# Patient Record
Sex: Female | Born: 1980 | Race: White | Hispanic: No | Marital: Married | Smoking: Never smoker
Health system: Southern US, Community
[De-identification: ages and names within clinical notes are randomized; demographics above are authoritative.]

## PROBLEM LIST (undated history)

## (undated) ENCOUNTER — Inpatient Hospital Stay (HOSPITAL_COMMUNITY): Payer: Self-pay

## (undated) DIAGNOSIS — O1423 HELLP syndrome (HELLP), third trimester: Secondary | ICD-10-CM

## (undated) DIAGNOSIS — Z789 Other specified health status: Secondary | ICD-10-CM

## (undated) HISTORY — PX: NO PAST SURGERIES: SHX2092

## (undated) SURGERY — Surgical Case
Anesthesia: *Unknown

---

## 2009-04-13 ENCOUNTER — Ambulatory Visit: Payer: Self-pay | Admitting: Family Medicine

## 2009-05-17 ENCOUNTER — Ambulatory Visit: Payer: Self-pay | Admitting: Family Medicine

## 2010-10-12 ENCOUNTER — Observation Stay (HOSPITAL_COMMUNITY)
Admission: AD | Admit: 2010-10-12 | Discharge: 2010-10-13 | Payer: Self-pay | Source: Home / Self Care | Attending: Obstetrics & Gynecology | Admitting: Obstetrics & Gynecology

## 2010-10-13 ENCOUNTER — Ambulatory Visit (HOSPITAL_COMMUNITY)
Admission: RE | Admit: 2010-10-13 | Discharge: 2010-10-13 | Payer: Self-pay | Source: Home / Self Care | Attending: Obstetrics & Gynecology | Admitting: Obstetrics & Gynecology

## 2010-10-14 ENCOUNTER — Inpatient Hospital Stay (HOSPITAL_COMMUNITY)
Admission: RE | Admit: 2010-10-14 | Discharge: 2010-10-17 | Payer: Self-pay | Source: Home / Self Care | Attending: Obstetrics & Gynecology | Admitting: Obstetrics & Gynecology

## 2010-10-14 ENCOUNTER — Encounter: Payer: Self-pay | Admitting: Obstetrics & Gynecology

## 2010-10-26 ENCOUNTER — Encounter: Payer: Self-pay | Admitting: Obstetrics and Gynecology

## 2010-10-26 ENCOUNTER — Ambulatory Visit: Payer: Self-pay | Admitting: Obstetrics and Gynecology

## 2010-10-26 LAB — CONVERTED CEMR LAB
ALT: 22 units/L (ref 0–35)
Alkaline Phosphatase: 121 units/L — ABNORMAL HIGH (ref 39–117)
BUN: 13 mg/dL (ref 6–23)
CO2: 23 meq/L (ref 19–32)
Chloride: 107 meq/L (ref 96–112)
Glucose, Bld: 89 mg/dL (ref 70–99)
HCT: 36.9 % (ref 36.0–46.0)
MCHC: 32 g/dL (ref 30.0–36.0)
RDW: 17 % — ABNORMAL HIGH (ref 11.5–15.5)
Sodium: 140 meq/L (ref 135–145)
Total Bilirubin: 0.3 mg/dL (ref 0.3–1.2)
Total Protein: 6.9 g/dL (ref 6.0–8.3)

## 2010-11-09 ENCOUNTER — Ambulatory Visit: Admit: 2010-11-09 | Payer: Self-pay | Admitting: Obstetrics and Gynecology

## 2010-11-24 NOTE — Discharge Summary (Addendum)
[name], [name]              ACCOUNT NO.:  [id number]  MEDICAL RECORD NO.:  [id number]          PATIENT TYPE:  OBV  LOCATION:  9155                          FACILITY:  [facility]  PHYSICIAN:  Allie Bossier, MD        DATE OF BIRTH:  04-Apr-1981  DATE OF ADMISSION:  10/12/2010 DATE OF DISCHARGE:  10/13/2010                              DISCHARGE SUMMARY   ADMISSION DIAGNOSES:  Estimated gestational age 31 and 6 weeks with hemolysis, elevated liver enzymes, and low platelet syndrome/severe preeclampsia.  DISCHARGE DIAGNOSIS:  Estimated gestational age 42 and 6 weeks with hemolysis, elevated liver enzymes, and low platelet syndrome/severe preeclampsia.  CONDITION:  Guarded.  MEDICATIONS:  Prenatal vitamins.  FOLLOWUP:  The patient has agreed to come back tomorrow, (the patient and her husband agreed to 1 o'clock in the afternoon) for readmission and induction.  HISTORY OF PRESENT ILLNESS AND HOSPITAL COURSE:  [name] is a 30 year old married white gravida 1, para 0 at 57 and 6 weeks' estimated gestational age.  She has had her prenatal care at [facility]. She reports that all was normal until she was seen several days ago (Monday) at that time her blood pressure was noted to be elevated with urine dipping for protein.  Her blood work was ordered and her LFTs were extremely elevated.  She was told to go to [facility] at [facility].  She went there and was told by them that they wanted to induce her.  They refused induction and signed out against medical advice.  When they came to the MAU 2 days later she does have a mild headache but denies nausea or vomiting or visual changes.  LABORATORY WORK:  Here showed her hemoglobin to 10, her platelets to be 87,000.  On October 12, 2010, her glucose was slightly elevated to 112 and her ALT was 57 which has significantly improved from her visit at [facility].  Her AST was 37 and her blood pressures were reasonable.  Hepatitis  panel was negative.  Amylase and lipase were normal.  Urine drug screen was negative, and she had brought with her a 24-hour urine from her collection at home and it showed 1260 mg of protein.  We went ahead and gave her another dose of betamethasone when she arrived.  When I met the patient for the first time on the 15, she and her husband were demanding that they leave against medical advice she says it was her birthday and they wanted to go home and celebrate and they wanted to "prep the puppies and kittens" at home.  She had a consult with Dr. Sherrie George the maternal fetal medicine specialist, who also advised induction of labor.  The patient and her husband understand that by not treating her severe HELLP syndrome that she can have stillbirth and she to have a seizure she can die.  They still signed out against medical advice, but they did agree to come back the following day for induction.     Allie Bossier, MD     MCD/MEDQ  D:  11/01/2010  T:  11/02/2010  Job:  [id number]  Electronically Signed by Nicholaus Bloom MD on 11/24/2010 03:47:28 PM

## 2011-01-09 LAB — CBC
HCT: 18.3 % — ABNORMAL LOW (ref 36.0–46.0)
HCT: 19.3 % — ABNORMAL LOW (ref 36.0–46.0)
HCT: 24.5 % — ABNORMAL LOW (ref 36.0–46.0)
HCT: 27.8 % — ABNORMAL LOW (ref 36.0–46.0)
Hemoglobin: 5.9 g/dL — CL (ref 12.0–15.0)
Hemoglobin: 6.6 g/dL — CL (ref 12.0–15.0)
Hemoglobin: 8.2 g/dL — ABNORMAL LOW (ref 12.0–15.0)
Hemoglobin: 9.4 g/dL — ABNORMAL LOW (ref 12.0–15.0)
Hemoglobin: 9.9 g/dL — ABNORMAL LOW (ref 12.0–15.0)
MCH: 25.9 pg — ABNORMAL LOW (ref 26.0–34.0)
MCH: 26.1 pg (ref 26.0–34.0)
MCH: 26.2 pg (ref 26.0–34.0)
MCH: 26.3 pg (ref 26.0–34.0)
MCH: 26.6 pg (ref 26.0–34.0)
MCH: 26.6 pg (ref 26.0–34.0)
MCH: 27.3 pg (ref 26.0–34.0)
MCHC: 32.3 g/dL (ref 30.0–36.0)
MCHC: 32.6 g/dL (ref 30.0–36.0)
MCHC: 33 g/dL (ref 30.0–36.0)
MCHC: 33.5 g/dL (ref 30.0–36.0)
MCHC: 34.1 g/dL (ref 30.0–36.0)
MCV: 78.9 fL (ref 78.0–100.0)
MCV: 82.3 fL (ref 78.0–100.0)
Platelets: 17 10*3/uL — CL (ref 150–400)
Platelets: 79 10*3/uL — ABNORMAL LOW (ref 150–400)
RBC: 2.51 MIL/uL — ABNORMAL LOW (ref 3.87–5.11)
RBC: 3.44 MIL/uL — ABNORMAL LOW (ref 3.87–5.11)
RBC: 3.95 MIL/uL (ref 3.87–5.11)
RDW: 16.9 % — ABNORMAL HIGH (ref 11.5–15.5)
RDW: 17 % — ABNORMAL HIGH (ref 11.5–15.5)
RDW: 17.4 % — ABNORMAL HIGH (ref 11.5–15.5)
RDW: 19 % — ABNORMAL HIGH (ref 11.5–15.5)
WBC: 11.8 10*3/uL — ABNORMAL HIGH (ref 4.0–10.5)
WBC: 13.4 10*3/uL — ABNORMAL HIGH (ref 4.0–10.5)
WBC: 14.5 10*3/uL — ABNORMAL HIGH (ref 4.0–10.5)
WBC: 16.9 10*3/uL — ABNORMAL HIGH (ref 4.0–10.5)
WBC: 17.4 10*3/uL — ABNORMAL HIGH (ref 4.0–10.5)

## 2011-01-09 LAB — CROSSMATCH
ABO/RH(D): A POS
Antibody Screen: NEGATIVE
Unit division: 0
Unit division: 0
Unit division: 0
Unit division: 0
Unit division: 0

## 2011-01-09 LAB — COMPREHENSIVE METABOLIC PANEL
ALT: 152 U/L — ABNORMAL HIGH (ref 0–35)
ALT: 222 U/L — ABNORMAL HIGH (ref 0–35)
ALT: 345 U/L — ABNORMAL HIGH (ref 0–35)
ALT: 355 U/L — ABNORMAL HIGH (ref 0–35)
ALT: 430 U/L — ABNORMAL HIGH (ref 0–35)
ALT: 515 U/L — ABNORMAL HIGH (ref 0–35)
ALT: 69 U/L — ABNORMAL HIGH (ref 0–35)
AST: 313 U/L — ABNORMAL HIGH (ref 0–37)
AST: 445 U/L — ABNORMAL HIGH (ref 0–37)
AST: 458 U/L — ABNORMAL HIGH (ref 0–37)
AST: 777 U/L — ABNORMAL HIGH (ref 0–37)
Albumin: 1.7 g/dL — ABNORMAL LOW (ref 3.5–5.2)
Albumin: 1.8 g/dL — ABNORMAL LOW (ref 3.5–5.2)
Albumin: 2.5 g/dL — ABNORMAL LOW (ref 3.5–5.2)
Alkaline Phosphatase: 100 U/L (ref 39–117)
Alkaline Phosphatase: 102 U/L (ref 39–117)
Alkaline Phosphatase: 103 U/L (ref 39–117)
Alkaline Phosphatase: 124 U/L — ABNORMAL HIGH (ref 39–117)
Alkaline Phosphatase: 93 U/L (ref 39–117)
Alkaline Phosphatase: 95 U/L (ref 39–117)
BUN: 10 mg/dL (ref 6–23)
BUN: 10 mg/dL (ref 6–23)
BUN: 11 mg/dL (ref 6–23)
BUN: 12 mg/dL (ref 6–23)
BUN: 13 mg/dL (ref 6–23)
BUN: 14 mg/dL (ref 6–23)
CO2: 25 mEq/L (ref 19–32)
CO2: 25 mEq/L (ref 19–32)
CO2: 25 mEq/L (ref 19–32)
CO2: 25 mEq/L (ref 19–32)
CO2: 26 mEq/L (ref 19–32)
Calcium: 6.8 mg/dL — ABNORMAL LOW (ref 8.4–10.5)
Calcium: 6.9 mg/dL — ABNORMAL LOW (ref 8.4–10.5)
Calcium: 7.2 mg/dL — ABNORMAL LOW (ref 8.4–10.5)
Calcium: 7.5 mg/dL — ABNORMAL LOW (ref 8.4–10.5)
Calcium: 7.7 mg/dL — ABNORMAL LOW (ref 8.4–10.5)
Calcium: 8.2 mg/dL — ABNORMAL LOW (ref 8.4–10.5)
Calcium: 8.5 mg/dL (ref 8.4–10.5)
Chloride: 100 mEq/L (ref 96–112)
Chloride: 101 mEq/L (ref 96–112)
Chloride: 102 mEq/L (ref 96–112)
Chloride: 103 mEq/L (ref 96–112)
Chloride: 104 mEq/L (ref 96–112)
Creatinine, Ser: 0.59 mg/dL (ref 0.4–1.2)
Creatinine, Ser: 0.7 mg/dL (ref 0.4–1.2)
GFR calc Af Amer: >60 mL/min (ref >60)
GFR calc Af Amer: >60 mL/min (ref >60)
GFR calc Af Amer: >60 mL/min (ref >60)
GFR calc Af Amer: >60 mL/min (ref >60)
GFR calc Af Amer: >60 mL/min (ref >60)
GFR calc non Af Amer: >60 mL/min (ref >60)
GFR calc non Af Amer: >60 mL/min (ref >60)
GFR calc non Af Amer: >60 mL/min (ref >60)
Glucose, Bld: 118 mg/dL — ABNORMAL HIGH (ref 70–99)
Glucose, Bld: 118 mg/dL — ABNORMAL HIGH (ref 70–99)
Glucose, Bld: 97 mg/dL (ref 70–99)
Glucose, Bld: 99 mg/dL (ref 70–99)
Potassium: 4.3 mEq/L (ref 3.5–5.1)
Potassium: 4.6 mEq/L (ref 3.5–5.1)
Potassium: 4.7 mEq/L (ref 3.5–5.1)
Potassium: 4.8 mEq/L (ref 3.5–5.1)
Potassium: 4.8 mEq/L (ref 3.5–5.1)
Sodium: 131 mEq/L — ABNORMAL LOW (ref 135–145)
Sodium: 131 mEq/L — ABNORMAL LOW (ref 135–145)
Sodium: 132 mEq/L — ABNORMAL LOW (ref 135–145)
Sodium: 134 mEq/L — ABNORMAL LOW (ref 135–145)
Sodium: 136 mEq/L (ref 135–145)
Sodium: 138 mEq/L (ref 135–145)
Total Bilirubin: 0.3 mg/dL (ref 0.3–1.2)
Total Bilirubin: 0.8 mg/dL (ref 0.3–1.2)
Total Bilirubin: 0.9 mg/dL (ref 0.3–1.2)
Total Protein: 4.3 g/dL — ABNORMAL LOW (ref 6.0–8.3)
Total Protein: 4.7 g/dL — ABNORMAL LOW (ref 6.0–8.3)
Total Protein: 5.9 g/dL — ABNORMAL LOW (ref 6.0–8.3)
Total Protein: 6 g/dL (ref 6.0–8.3)

## 2011-01-09 LAB — PREPARE PLATELETS: Unit division: 0

## 2011-01-09 LAB — RPR: RPR Ser Ql: NONREACTIVE

## 2011-01-09 LAB — FIBRINOGEN: Fibrinogen: 258 mg/dL (ref 204–475)

## 2011-01-09 LAB — URIC ACID: Uric Acid, Serum: 6.2 mg/dL (ref 2.4–7.0)

## 2011-01-09 LAB — ABO/RH: ABO/RH(D): A POS

## 2011-01-10 LAB — COMPREHENSIVE METABOLIC PANEL
ALT: 57 U/L — ABNORMAL HIGH (ref 0–35)
AST: 37 U/L (ref 0–37)
Albumin: 2.3 g/dL — ABNORMAL LOW (ref 3.5–5.2)
Alkaline Phosphatase: 96 U/L (ref 39–117)
Chloride: 108 mEq/L (ref 96–112)
GFR calc Af Amer: >60 mL/min (ref >60)
Potassium: 4.2 mEq/L (ref 3.5–5.1)
Sodium: 139 mEq/L (ref 135–145)
Total Bilirubin: 0.4 mg/dL (ref 0.3–1.2)

## 2011-01-10 LAB — DIFFERENTIAL
Basophils Absolute: 0 10*3/uL (ref 0.0–0.1)
Basophils Relative: 0 % (ref 0–1)
Eosinophils Relative: 0 % (ref 0–5)
Lymphocytes Relative: 21 % (ref 12–46)
Monocytes Absolute: 0.5 10*3/uL (ref 0.1–1.0)

## 2011-01-10 LAB — URINE CULTURE
Colony Count: 35000
Culture  Setup Time: 201112142146

## 2011-01-10 LAB — CBC
Platelets: 87 10*3/uL — ABNORMAL LOW (ref 150–400)
RBC: 3.62 MIL/uL — ABNORMAL LOW (ref 3.87–5.11)
WBC: 11.1 10*3/uL — ABNORMAL HIGH (ref 4.0–10.5)

## 2011-01-10 LAB — CREATININE CLEARANCE, URINE, 24 HOUR
Collection Interval-CRCL: 24 hours
Urine Total Volume-CRCL: 3000 mL

## 2011-01-10 LAB — URINALYSIS, ROUTINE W REFLEX MICROSCOPIC
Bilirubin Urine: NEGATIVE
Nitrite: NEGATIVE
Specific Gravity, Urine: 1.025 (ref 1.005–1.030)
Urobilinogen, UA: 0.2 mg/dL (ref 0.0–1.0)

## 2011-01-10 LAB — PROTEIN / CREATININE RATIO, URINE
Protein Creatinine Ratio: 2.8 — ABNORMAL HIGH (ref 0.00–0.15)
Total Protein, Urine: 341 mg/dL

## 2011-01-10 LAB — RAPID URINE DRUG SCREEN, HOSP PERFORMED
Barbiturates: NOT DETECTED
Benzodiazepines: NOT DETECTED

## 2011-01-10 LAB — PROTEIN, URINE, 24 HOUR
Collection Interval-UPROT: 24 hours
Protein, Urine: 42 mg/dL

## 2011-01-10 LAB — URINE MICROSCOPIC-ADD ON

## 2011-01-10 LAB — LIPASE, BLOOD: Lipase: 35 U/L (ref 11–59)

## 2011-01-10 LAB — HEPATITIS PANEL, ACUTE: HCV Ab: NEGATIVE

## 2011-04-11 IMAGING — CR DG CHEST 2V
1 series · 2 of 2 positions shown · non-contrast
Comparison: none

REASON FOR EXAM: dyspnea
COMMENTS:

[Series 1: view not recorded · 0.17mm/px · 2 of 2 slices shown]
[im 1/2]
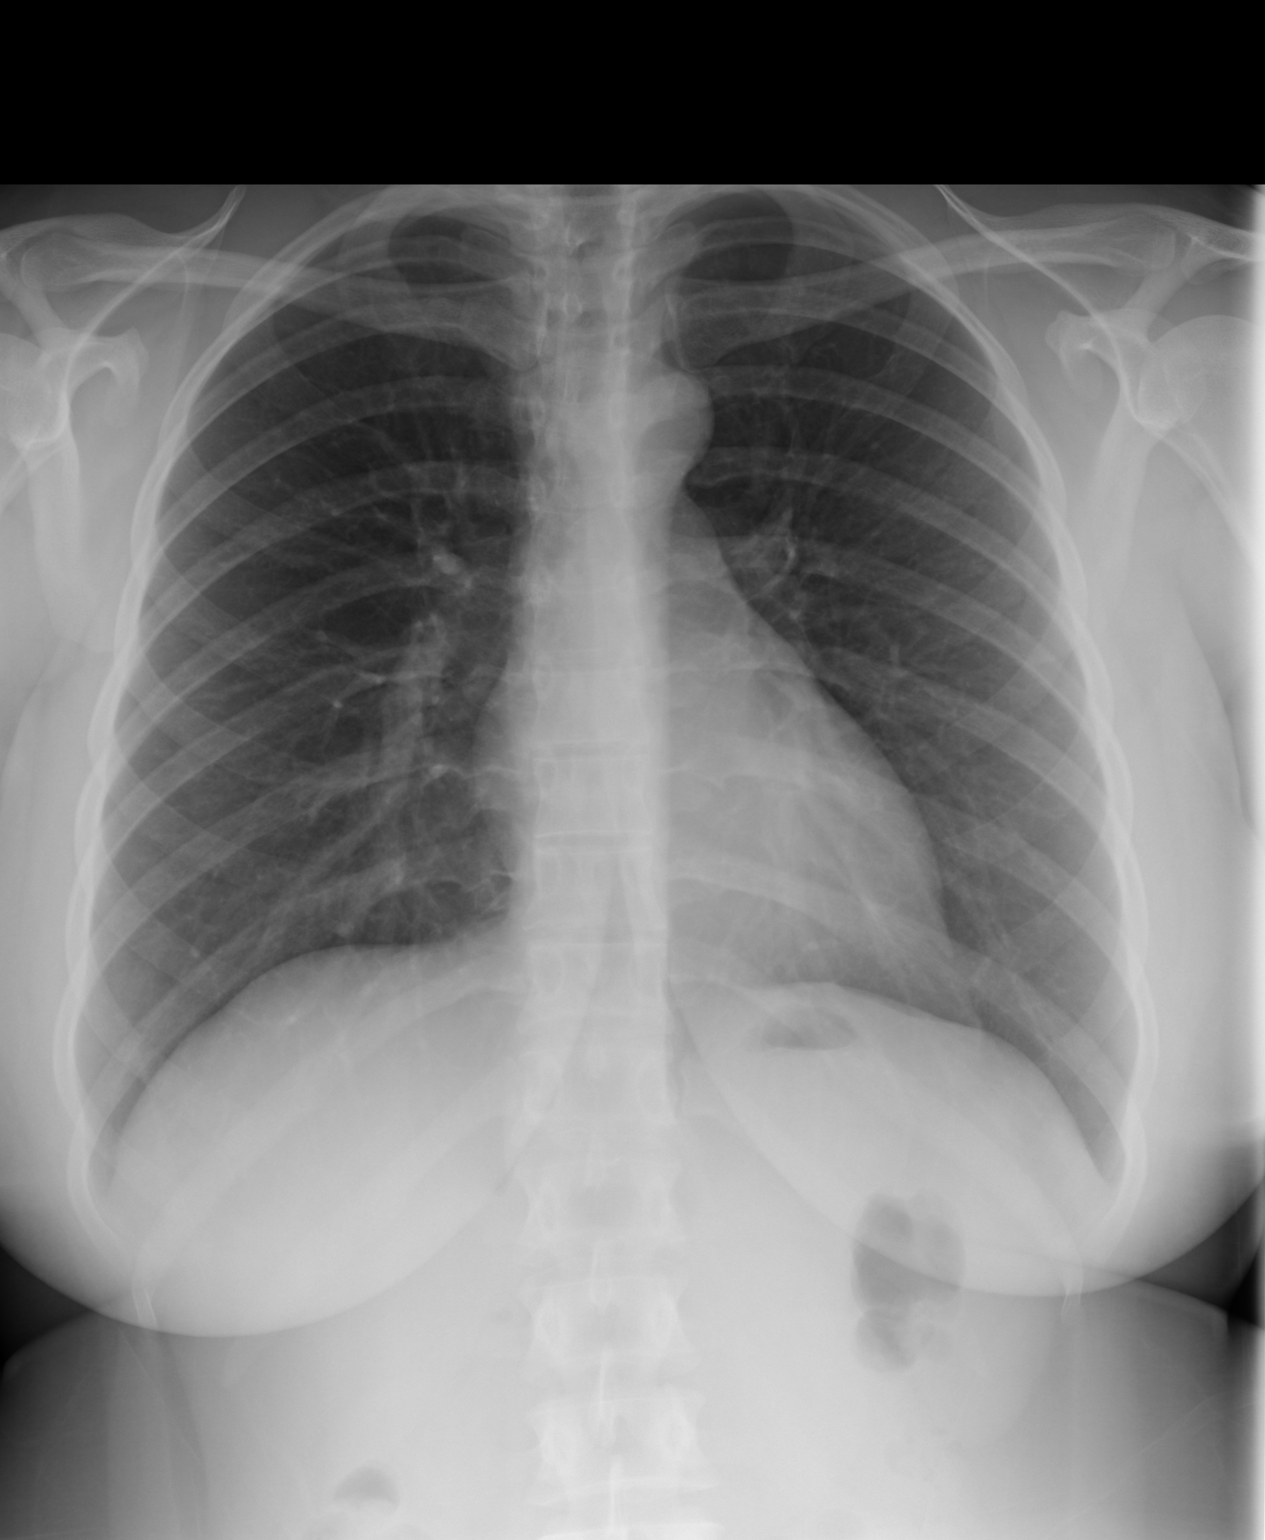
[im 2/2]
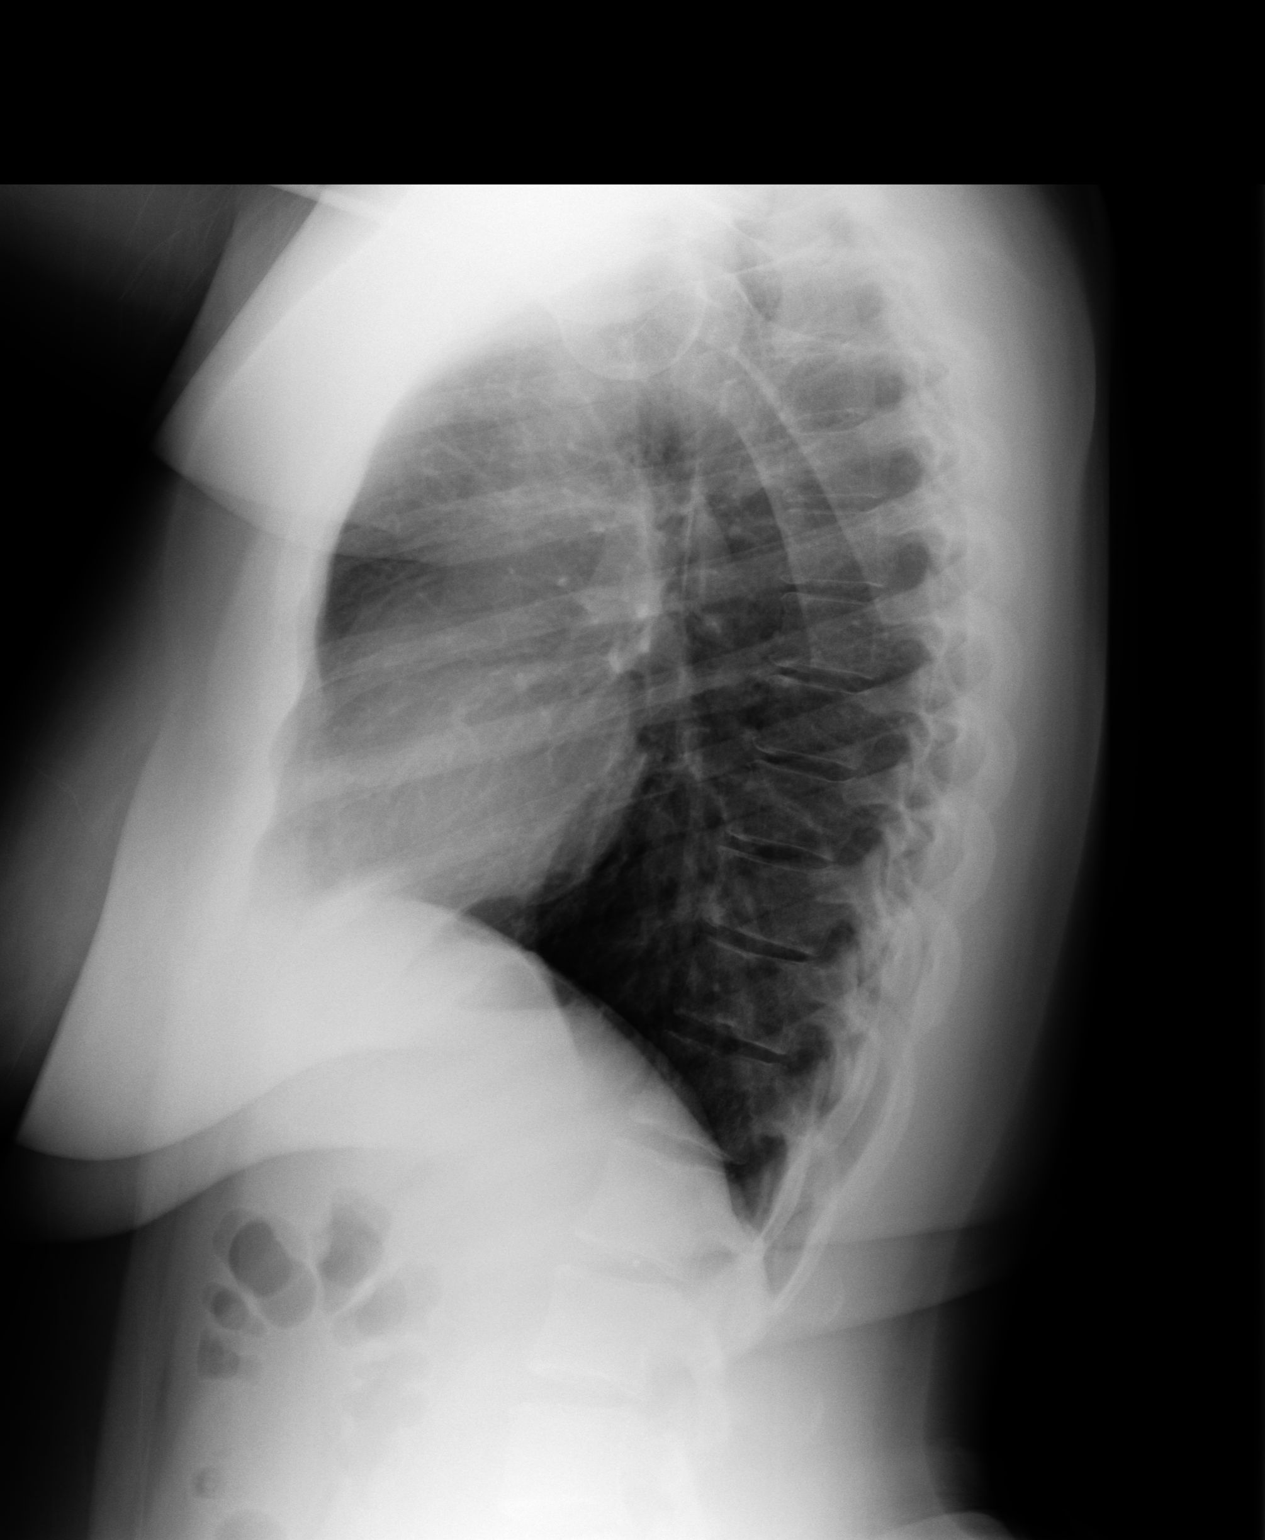

[2 of 2 positions shown; findings below may reference images not displayed]

PROCEDURE:     DXR - DXR CHEST PA (OR AP) AND LATERAL  - May 17, 2009  [DATE]

RESULT:     There is no previous exam for comparison.

The lungs are clear. The heart and pulmonary vessels are normal. The bony
and mediastinal structures are unremarkable. There is no effusion. There is
no pneumothorax or evidence of congestive failure.
IMPRESSION: No acute cardiopulmonary disease.

## 2011-10-31 NOTE — L&D Delivery Note (Signed)
Delivery Note At 4:23 PM a viable female was delivered via Vaginal, Spontaneous Delivery (Presentation: ; Occiput Anterior). APGAR: 9, 9; weight 7 lb 3.5 oz (3274 g).   Placenta status: Intact, Spontaneous.  Cord: 3 vessels   Anesthesia: Local  Episiotomy: None Lacerations: 2nd degree Suture Repair: 3.0 monocryl Est. Blood Loss (mL):   Mom to postpartum.  Baby to rooming in.  [name] 01/05/2012, 6:23 PM

## 2011-11-22 LAB — HIV ANTIBODY (ROUTINE TESTING W REFLEX): HIV: NONREACTIVE

## 2011-11-22 LAB — CBC: Platelets: 201 10*3/uL (ref 150–399)

## 2011-11-22 LAB — ABO/RH: RH Type: POSITIVE

## 2011-11-24 ENCOUNTER — Inpatient Hospital Stay (HOSPITAL_COMMUNITY)
Admission: AD | Admit: 2011-11-24 | Discharge: 2011-11-26 | DRG: 781 | Disposition: A | Discharge status: Home / Self Care | Payer: Medicaid Other | Source: Ambulatory Visit | Attending: Obstetrics and Gynecology | Admitting: Obstetrics and Gynecology

## 2011-11-24 ENCOUNTER — Encounter (HOSPITAL_COMMUNITY): Payer: Self-pay | Admitting: *Deleted

## 2011-11-24 DIAGNOSIS — O09899 Supervision of other high risk pregnancies, unspecified trimester: Secondary | ICD-10-CM

## 2011-11-24 DIAGNOSIS — O0930 Supervision of pregnancy with insufficient antenatal care, unspecified trimester: Secondary | ICD-10-CM

## 2011-11-24 DIAGNOSIS — O3430 Maternal care for cervical incompetence, unspecified trimester: Secondary | ICD-10-CM | POA: Diagnosis present

## 2011-11-24 DIAGNOSIS — R4789 Other speech disturbances: Secondary | ICD-10-CM | POA: Diagnosis present

## 2011-11-24 DIAGNOSIS — O99810 Abnormal glucose complicating pregnancy: Secondary | ICD-10-CM | POA: Diagnosis present

## 2011-11-24 DIAGNOSIS — O24419 Gestational diabetes mellitus in pregnancy, unspecified control: Secondary | ICD-10-CM | POA: Diagnosis present

## 2011-11-24 DIAGNOSIS — O09299 Supervision of pregnancy with other poor reproductive or obstetric history, unspecified trimester: Secondary | ICD-10-CM

## 2011-11-24 DIAGNOSIS — O4700 False labor before 37 completed weeks of gestation, unspecified trimester: Secondary | ICD-10-CM | POA: Diagnosis present

## 2011-11-24 DIAGNOSIS — IMO0002 Reserved for concepts with insufficient information to code with codable children: Secondary | ICD-10-CM | POA: Diagnosis present

## 2011-11-24 DIAGNOSIS — O26879 Cervical shortening, unspecified trimester: Principal | ICD-10-CM | POA: Diagnosis present

## 2011-11-24 DIAGNOSIS — O3660X0 Maternal care for excessive fetal growth, unspecified trimester, not applicable or unspecified: Secondary | ICD-10-CM | POA: Diagnosis present

## 2011-11-24 LAB — GLUCOSE, CAPILLARY: Glucose-Capillary: 199 mg/dL — ABNORMAL HIGH (ref 70–99)

## 2011-11-24 MED ORDER — PRENATAL MULTIVITAMIN CH: 1.0000 | ORAL_TABLET | Freq: Every day | ORAL | Status: DC

## 2011-11-24 MED ORDER — DOCUSATE SODIUM 100 MG PO CAPS: 100.0000 mg | ORAL_CAPSULE | Freq: Every day | ORAL | Status: DC

## 2011-11-24 MED ORDER — ACETAMINOPHEN 325 MG PO TABS: 650.0000 mg | ORAL_TABLET | ORAL | Status: DC | PRN

## 2011-11-24 MED ORDER — BETAMETHASONE SOD PHOS & ACET 6 (3-3) MG/ML IJ SUSP
12.0000 mg | INTRAMUSCULAR | Status: AC
  Administered 2011-11-24 – 2011-11-25 (×2): 12 mg via INTRAMUSCULAR
  Filled 2011-11-24 (×2): qty 2

## 2011-11-24 MED ORDER — PRENATAL MULTIVITAMIN CH
1.0000 | ORAL_TABLET | Freq: Every day | ORAL | Status: DC
  Administered 2011-11-24 – 2011-11-25 (×2): 1 via ORAL
  Filled 2011-11-24 (×2): qty 1

## 2011-11-24 MED ORDER — ZOLPIDEM TARTRATE 10 MG PO TABS: 10.0000 mg | ORAL_TABLET | Freq: Every evening | ORAL | Status: DC | PRN

## 2011-11-24 MED ORDER — CALCIUM CARBONATE ANTACID 500 MG PO CHEW
2.0000 | CHEWABLE_TABLET | ORAL | Status: DC | PRN
  Administered 2011-11-24 – 2011-11-25 (×2): 200 mg via ORAL
  Filled 2011-11-24: qty 2
  Filled 2011-11-24: qty 1

## 2011-11-24 MED ORDER — DOCUSATE SODIUM 100 MG PO CAPS
100.0000 mg | ORAL_CAPSULE | Freq: Every day | ORAL | Status: DC
  Administered 2011-11-24 – 2011-11-25 (×2): 100 mg via ORAL
  Filled 2011-11-24 (×2): qty 1
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
## 2011-11-24 NOTE — Progress Notes (Signed)
Notified Dr. Stefano Gaul of 2hr pp cbg of 199, no new orders received at this time. Cheral Marker

## 2011-11-24 NOTE — Progress Notes (Signed)
Reviewed 1 hour glucola result of 200 with patient, and discussed plan of care with Dr. Stefano Gaul. Plan carb modified diet, FBS and 2 hour pcs, Patient seems to understand issue and is agreeable with plan.  Nigel Bridgeman, CNM, [place] 11/24/11 7p

## 2011-11-25 LAB — GLUCOSE, CAPILLARY
Glucose-Capillary: 125 mg/dL — ABNORMAL HIGH (ref 70–99)
Glucose-Capillary: 167 mg/dL — ABNORMAL HIGH (ref 70–99)

## 2011-11-25 NOTE — Progress Notes (Signed)
Notified [name], CNM of onset of mild uc's, not perceived by pt, approximately 1 hour ago, increasing in frequency to q 2-5, and continuing despite pt voiding.  Stated to have pt drink plenty of water over 15 minutes and keep bladder empty- call back if not resolving.   Cheral Marker

## 2011-11-25 NOTE — Progress Notes (Addendum)
31 y.o. year old female,at [redacted]w[redacted]d gestation.  SUBJECTIVE:  Patient reports that she is feeling better today. She feels very few contractions.  OBJECTIVE:  BP 117/78  Pulse 98  Temp(Src) 98 F (36.7 C) (Oral)  Resp 18  Ht 5\' 1"  (1.549 m)  Wt 95.255 kg (210 lb)  BMI 39.68 kg/m2  SpO2 99%  Fetal Heart Tones:  Category 1  Contractions:          Mild contractions versus uterine irritability  Chest: Clear  Heart: Regular rate and rhythm  Abdomen: Gravid, nontender  Extremities: No masses, nontender  Fasting blood sugar: 125  2 hour p.c. blood sugars: 199, 200, 154  ASSESSMENT:  [redacted]w[redacted]d Weeks Pregnancy  Preterm uterine contractions  Preterm cervical change  Gestational diabetes with elevated blood sugars do to betamethasone  PLAN:  The patient will receive her second dose of betamethasone today.  We will continue to monitor her blood sugars.  We will obtain a fetal fibronectin tomorrow.  We will check the results of her beta strep, gonorrhea, and chlamydia test.  If the patient remains stable and if her fetal fibronectin is negative, then we will consider discharging the patient to home on 11/26/2011. She will begin to make arrangements to maintain bed rest once she gets home.  Leonard Schwartz, M.D.

## 2011-11-26 DIAGNOSIS — O24419 Gestational diabetes mellitus in pregnancy, unspecified control: Secondary | ICD-10-CM | POA: Diagnosis present

## 2011-11-26 DIAGNOSIS — O3430 Maternal care for cervical incompetence, unspecified trimester: Secondary | ICD-10-CM | POA: Diagnosis present

## 2011-11-26 DIAGNOSIS — O26879 Cervical shortening, unspecified trimester: Secondary | ICD-10-CM | POA: Diagnosis present

## 2011-11-26 DIAGNOSIS — R479 Unspecified speech disturbances: Secondary | ICD-10-CM | POA: Insufficient documentation

## 2011-11-26 DIAGNOSIS — IMO0002 Reserved for concepts with insufficient information to code with codable children: Secondary | ICD-10-CM | POA: Diagnosis present

## 2011-11-26 LAB — GLUCOSE, CAPILLARY: Glucose-Capillary: 153 mg/dL — ABNORMAL HIGH (ref 70–99)

## 2011-11-26 MED ORDER — MORPHINE SULFATE 4 MG/ML IJ SOLN: 8.0000 mg | INTRAMUSCULAR | Status: DC | PRN

## 2011-11-26 MED ORDER — GENTAMICIN IN SALINE 1.2-0.9 MG/ML-% IV SOLN: 60.0000 mg | Freq: Three times a day (TID) | INTRAVENOUS | Status: DC

## 2011-11-26 NOTE — Discharge Summary (Signed)
Physician Discharge Summary  Patient ID: [name] MRN: [id number] DOB/AGE: December 09, 1980 31 y.o.  Admit date: 11/24/2011 Discharge date: 11/26/2011  Admission Diagnoses: 1.  No prenatal care to date, and Late to Care  2.  Cervical shortening and funneling on u/s 11/24/11 at office  3.  H/o PTD secondary to IOL due to HELLP syndrome  4.  Unsure LMP  5.  Elevated HgA1C=6.1 at NOB interview  6.  H/o irregular cycles  7.  Mild speech impediment  8.  LGA on u/s 11/24/11   Discharge Diagnoses: 1.  IUP at 31.4  2. Preterm cervical changes/dynamic cx, but so s/s of Preterm labor and FFN negative on 11/26/11  3.  Gestational diabetes  4.  Cat I FHT  5.  Incomplete anatomy scan on 11/24/11 at [facility] ( 6. Additional dx on admission to d/c list of dx as well) Principal Problem:  *Cervix funneling complicating pregnancy Active Problems:  Late prenatal care complicating pregnancy  Preterm labor  Hx of HELLP syndrome, currently pregnant  Gestational diabetes  LGA (large for gestational age) fetus  History of preterm delivery, currently pregnant  Cervix, short (affecting pregnancy)   Discharged Condition: stable  Hospital Course: Patient admitted 11/24/11 for further eval of preterm cervical shortening and funneling (dynamic) noted on u/s at office 11/24/11.  Pt has had occ'l ctx during 2 day stay--hasn't received any tocolytics.  She has received 2 doses of BMZ.  She was also dx'd with gestational diabetes w/ 1hr gtt=200.  CBG's have all ranged from 125-199, and hasn't received any treatment secondary to elevations r/t BMZ.  She is to continue cbg's 4x/day and record them and bring to next visit.  At time of d/c she denies any contractions or tightenings.  Denies any VB, LOF, or abnl d/c.  FFN done this AM was negative.  She will begin antenatal testing next week, and continue weekly prenatal visits thus forward until delivery.  She has been normotensive and no s/s of PreEclampsia or HELLP syndrome.  Cx  closed/50%/-3 per Dr. Debria Garret exam this morning.    Consults: None  Significant Diagnostic Studies: cbg's; FFN;  Has gc/ct and gbs pending   Treatments: steroids: Betamethasone course x2 .Marland Kitchen Filed Vitals:   11/25/11 1957 11/26/11 0016 11/26/11 0745 11/26/11 1204  BP:  114/77 110/78 118/82  Pulse: 100 90 74 92  Temp: 98 F (36.7 C) 98.1 F (36.7 C) 98 F (36.7 C) 98.2 F (36.8 C)  TempSrc: Oral Oral Oral   Resp: 20 18 18 18   Height:      Weight:      SpO2:      .Marland Kitchen Results for orders placed during the hospital encounter of 11/24/11 (from the past 72 hour(s))  GLUCOSE TOLERANCE, 1 HOUR     Status: Abnormal   Collection Time   11/24/11  3:54 PM      Component Value Range Comment   Glucose, 1 Hour GTT 200 (*) 70 - 140 (mg/dL)   GLUCOSE, CAPILLARY     Status: Abnormal   Collection Time   11/24/11  8:55 PM      Component Value Range Comment   Glucose-Capillary 199 (*) 70 - 99 (mg/dL)    Comment 1 Notify RN      Comment 2 Documented in Chart     GLUCOSE, CAPILLARY     Status: Abnormal   Collection Time   11/25/11  6:18 AM      Component Value Range Comment   Glucose-Capillary  125 (*) 70 - 99 (mg/dL)    Comment 1 Documented in Chart     GLUCOSE, CAPILLARY     Status: Abnormal   Collection Time   11/25/11 10:57 AM      Component Value Range Comment   Glucose-Capillary 154 (*) 70 - 99 (mg/dL)   GLUCOSE, CAPILLARY     Status: Abnormal   Collection Time   11/25/11  2:42 PM      Component Value Range Comment   Glucose-Capillary 144 (*) 70 - 99 (mg/dL)   GLUCOSE, CAPILLARY     Status: Abnormal   Collection Time   11/25/11  9:40 PM      Component Value Range Comment   Glucose-Capillary 167 (*) 70 - 99 (mg/dL)   GLUCOSE, CAPILLARY     Status: Abnormal   Collection Time   11/26/11  5:56 AM      Component Value Range Comment   Glucose-Capillary 135 (*) 70 - 99 (mg/dL)   FETAL FIBRONECTIN     Status: Normal   Collection Time   11/26/11  7:15 AM      Component Value Range  Comment   Fetal Fibronectin NEGATIVE  NEGATIVE    GLUCOSE, CAPILLARY     Status: Abnormal   Collection Time   11/26/11 10:07 AM      Component Value Range Comment   Glucose-Capillary 156 (*) 70 - 99 (mg/dL)   GLUCOSE, CAPILLARY     Status: Abnormal   Collection Time   11/26/11 12:38 PM      Component Value Range Comment   Glucose-Capillary 153 (*) 70 - 99 (mg/dL)    Comment 1 Notify RN      Discharge Exam: Blood pressure 118/82, pulse 92, temperature 98.2 F (36.8 C), temperature source Oral, resp. rate 18, height 5\' 1"  (1.549 m), weight 95.255 kg (210 lb), SpO2 99.00%. see Dr. [place] Dragon Stringer's progress noted from today, 11/26/11  Disposition: d/c home on bedrest and pelvic rest. Continue CBG's 4x/day--Rx given to pick up glucometer today--pt watched CBG video before d/c and practiced one cbg w/ RN observing.  Office to call pt tomorrow to schedule outpatient nutrition/DM class and schedule f/u anatomy either at [facility] or MFM secondary to limited views Friday 11/24/11 due to maternal body habitus.  Given d/c education materials on FKC, PTL s/s, CHO counting, and Gestational diabetes.  Pt to call prn any questions or concerns.    Discharge Orders    Future Orders Please Complete By Expires   HIV antibody      Comments:   This external order was created through the Results Console.   CBC      Comments:   This external order was created through the Results Console.   Rubella antibody, IgM      Comments:   This external order was created through the Results Console.   RPR      Comments:   This external order was created through the Results Console.   ABO/Rh      Comments:   This external order was created through the Results Console.     Medication List  As of 11/26/2011 12:39 PM   STOP taking these medications         dextromethorphan 15 MG/5ML syrup         TAKE these medications         dextromethorphan-guaiFENesin 30-600 MG per 12 hr tablet   Commonly known as:  MUCINEX DM   Take 1  tablet by mouth every 12 (twelve) hours. Patient used this medication for congestion and cold symptoms.      prenatal multivitamin Tabs   Take 1 tablet by mouth daily.           Follow-up Information    Follow up with [facility]. Schedule an appointment as soon as possible for a visit on 11/30/2011. (office will call tomorrow to schedule appointment berfore end of this week, or call  as needed if symptoms worsen, or any questions)          Signed: Antonietta Breach 11/26/2011, 12:39 PM

## 2011-11-26 NOTE — Discharge Instructions (Signed)
Fetal Movement Counts Patient Name: _____[name]_____________________________ Patient Due Date: ___03/27/2013________ Kick counts is highly recommended in high risk pregnancies, but it is a good idea for every pregnant woman to do. Start counting fetal movements at 28 weeks of the pregnancy. Fetal movements increase after eating a full meal or eating or drinking something sweet (the blood sugar is higher). It is also important to drink plenty of fluids (well hydrated) before doing the count. Lie on your left side because it helps with the circulation or you can sit in a comfortable chair with your arms over your belly (abdomen) with no distractions around you. DOING THE COUNT Try to do the count the same time of day each time you do it.  Mark the day and time, then see how long it takes for you to feel 10 movements (kicks, flutters, swishes, rolls). You should have at least 10 movements within 2 hours. You will most likely feel 10 movements in much less than 2 hours. If you do not, wait an hour and count again. After a couple of days you will see a pattern.  What you are looking for is a change in the pattern or not enough counts in 2 hours. Is it taking longer in time to reach 10 movements?  SEEK MEDICAL CARE IF: You feel less than 10 counts in 2 hours. Tried twice.  No movement in one hour.  The pattern is changing or taking longer each day to reach 10 counts in 2 hours.  You feel the baby is not moving as it usually does.  Date: ____________ Movements: ____________ Start time: ____________ Doreatha Martin time: ____________  Date: ____________ Movements: ____________ Start time: ____________ Doreatha Martin time: ____________ Date: ____________ Movements: ____________ Start time: ____________ Doreatha Martin time: ____________ Date: ____________ Movements: ____________ Start time: ____________ Doreatha Martin time: ____________ Date: ____________ Movements: ____________ Start time: ____________ Doreatha Martin time:  ____________ Date: ____________ Movements: ____________ Start time: ____________ Doreatha Martin time: ____________ Date: ____________ Movements: ____________ Start time: ____________ Doreatha Martin time: ____________ Date: ____________ Movements: ____________ Start time: ____________ Doreatha Martin time: ____________  Date: ____________ Movements: ____________ Start time: ____________ Doreatha Martin time: ____________ Date: ____________ Movements: ____________ Start time: ____________ Doreatha Martin time: ____________ Date: ____________ Movements: ____________ Start time: ____________ Doreatha Martin time: ____________ Date: ____________ Movements: ____________ Start time: ____________ Doreatha Martin time: ____________ Date: ____________ Movements: ____________ Start time: ____________ Doreatha Martin time: ____________ Date: ____________ Movements: ____________ Start time: ____________ Doreatha Martin time: ____________ Date: ____________ Movements: ____________ Start time: ____________ Doreatha Martin time: ____________  Date: ____________ Movements: ____________ Start time: ____________ Doreatha Martin time: ____________ Date: ____________ Movements: ____________ Start time: ____________ Doreatha Martin time: ____________ Date: ____________ Movements: ____________ Start time: ____________ Doreatha Martin time: ____________ Date: ____________ Movements: ____________ Start time: ____________ Doreatha Martin time: ____________ Date: ____________ Movements: ____________ Start time: ____________ Doreatha Martin time: ____________ Date: ____________ Movements: ____________ Start time: ____________ Doreatha Martin time: ____________ Date: ____________ Movements: ____________ Start time: ____________ Doreatha Martin time: ____________  Date: ____________ Movements: ____________ Start time: ____________ Doreatha Martin time: ____________ Date: ____________ Movements: ____________ Start time: ____________ Doreatha Martin time: ____________ Date: ____________ Movements: ____________ Start time: ____________ Doreatha Martin time: ____________ Date: ____________ Movements:  ____________ Start time: ____________ Doreatha Martin time: ____________ Date: ____________ Movements: ____________ Start time: ____________ Doreatha Martin time: ____________ Date: ____________ Movements: ____________ Start time: ____________ Doreatha Martin time: ____________ Date: ____________ Movements: ____________ Start time: ____________ Doreatha Martin time: ____________  Date: ____________ Movements: ____________ Start time: ____________ Doreatha Martin time: ____________ Date: ____________ Movements: ____________ Start time: ____________ Doreatha Martin time: ____________ Date: ____________ Movements: ____________ Start time: ____________ Doreatha Martin time: ____________ Date: ____________  Movements: ____________ Start time: ____________ Doreatha Martin time: ____________ Date: ____________ Movements: ____________ Start time: ____________ Doreatha Martin time: ____________ Date: ____________ Movements: ____________ Start time: ____________ Doreatha Martin time: ____________ Date: ____________ Movements: ____________ Start time: ____________ Doreatha Martin time: ____________  Date: ____________ Movements: ____________ Start time: ____________ Doreatha Martin time: ____________ Date: ____________ Movements: ____________ Start time: ____________ Doreatha Martin time: ____________ Date: ____________ Movements: ____________ Start time: ____________ Doreatha Martin time: ____________ Date: ____________ Movements: ____________ Start time: ____________ Doreatha Martin time: ____________ Date: ____________ Movements: ____________ Start time: ____________ Doreatha Martin time: ____________ Date: ____________ Movements: ____________ Start time: ____________ Doreatha Martin time: ____________ Date: ____________ Movements: ____________ Start time: ____________ Doreatha Martin time: ____________  Date: ____________ Movements: ____________ Start time: ____________ Doreatha Martin time: ____________ Date: ____________ Movements: ____________ Start time: ____________ Doreatha Martin time: ____________ Date: ____________ Movements: ____________ Start time: ____________ Doreatha Martin  time: ____________ Date: ____________ Movements: ____________ Start time: ____________ Doreatha Martin time: ____________ Date: ____________ Movements: ____________ Start time: ____________ Doreatha Martin time: ____________ Date: ____________ Movements: ____________ Start time: ____________ Doreatha Martin time: ____________ Date: ____________ Movements: ____________ Start time: ____________ Doreatha Martin time: ____________  Date: ____________ Movements: ____________ Start time: ____________ Doreatha Martin time: ____________ Date: ____________ Movements: ____________ Start time: ____________ Doreatha Martin time: ____________ Date: ____________ Movements: ____________ Start time: ____________ Doreatha Martin time: ____________ Date: ____________ Movements: ____________ Start time: ____________ Doreatha Martin time: ____________ Date: ____________ Movements: ____________ Start time: ____________ Doreatha Martin time: ____________ Date: ____________ Movements: ____________ Start time: ____________ Doreatha Martin time: ____________ Document Released: 11/15/2006 Document Revised: 06/28/2011 Document Reviewed: 05/18/2009 [employer] Patient Information 2012 [employer].Braxton Hicks Contractions Pregnancy is commonly associated with contractions of the uterus throughout the pregnancy. Towards the end of pregnancy (32 to 34 weeks), these contractions [facility] Willa Rough) can develop more often and may become more forceful. This is not true labor because these contractions do not result in opening (dilatation) and thinning of the cervix. They are sometimes difficult to tell apart from true labor because these contractions can be forceful and people have different pain tolerances. You should not feel embarrassed if you go to the hospital with false labor. Sometimes, the only way to tell if you are in true labor is for your caregiver to follow the changes in the cervix. How to tell the difference between true and false labor:  False labor.   The contractions of false labor are usually shorter,  irregular and not as hard as those of true labor.   They are often felt in the front of the lower abdomen and in the groin.   They may leave with walking around or changing positions while lying down.   They get weaker and are shorter lasting as time goes on.   These contractions are usually irregular.   They do not usually become progressively stronger, regular and closer together as with true labor.   True labor.   Contractions in true labor last 30 to 70 seconds, become very regular, usually become more intense, and increase in frequency.   They do not go away with walking.   The discomfort is usually felt in the top of the uterus and spreads to the lower abdomen and low back.   True labor can be determined by your caregiver with an exam. This will show that the cervix is dilating and getting thinner.  If there are no prenatal problems or other health problems associated with the pregnancy, it is completely safe to be sent home with false labor and await the onset of true labor. HOME CARE INSTRUCTIONS   Keep up with your usual exercises and instructions.  Take medications as directed.   Keep your regular prenatal appointment.   Eat and drink lightly if you think you are going into labor.   If BH contractions are making you uncomfortable:   Change your activity position from lying down or resting to walking/walking to resting.   Sit and rest in a tub of warm water.   Drink 2 to 3 glasses of water. Dehydration may cause B-H contractions.   Do slow and deep breathing several times an hour.  SEEK IMMEDIATE MEDICAL CARE IF:   Your contractions continue to become stronger, more regular, and closer together.   You have a gushing, burst or leaking of fluid from the vagina.   An oral temperature above 102 F (38.9 C) develops.   You have passage of blood-tinged mucus.   You develop vaginal bleeding.   You develop continuous belly (abdominal) pain.   You have low  back pain that you never had before.   You feel the baby's head pushing down causing pelvic pressure.   The baby is not moving as much as it used to.  Document Released: 10/16/2005 Document Revised: 06/28/2011 Document Reviewed: 04/09/2009 [facility] Patient Information 2012 [place], [place]. Preventing Preterm Labor Preterm labor is when a pregnant woman has contractions that cause the cervix to open, shorten, and thin before 37 weeks of pregnancy. You will have regular contractions (tightening) 2 to 3 minutes apart. This usually causes discomfort or pain. HOME CARE  Eat a healthy diet.   Take your vitamins as told by your doctor.   Drink enough fluids to keep your pee (urine) clear or pale yellow every day.   Get rest and sleep.   Do not have sex if you are at high risk for preterm labor.   Follow your doctor's advice about activity, medicines, and tests.   Avoid stress.   Avoid hard labor or exercise that lasts for a long time.   Do not smoke.  GET HELP RIGHT AWAY IF:   You are having contractions.   You have belly (abdominal) pain.   You have bleeding from your vagina.   You have pain when you pee (urinate).   You have abnormal discharge from your vagina.   You have a temperature by mouth above 102 F (38.9 C).  MAKE SURE YOU:  Understand these instructions.   Will watch your condition.   Will get help if you are not doing well or get worse.  Document Released: 01/12/2009 Document Revised: 06/28/2011 Document Reviewed: 01/12/2009 [facility] Patient Information 2012 [place], [place]. Gestational Diabetes Mellitus Gestational diabetes mellitus (GDM) is diabetes that occurs only during pregnancy. This happens when the body cannot properly handle the glucose (sugar) that increases in the blood after eating. During pregnancy, insulin resistance (reduced sensitivity to insulin) occurs because of the release of hormones from the placenta. Usually, the pancreas of pregnant  women produces enough insulin to overcome the resistance that occurs. However, in gestational diabetes, the insulin is there but it does not work effectively. If the resistance is severe enough that the pancreas does not produce enough insulin, extra glucose builds up in the blood.  WHO IS AT RISK FOR DEVELOPING GESTATIONAL DIABETES?  Women with a history of diabetes in the family.   Women over age 48.   Women who are overweight.   Women in certain ethnic groups (Hispanic, African American, Native American, [place] and [place] Islander).  WHAT CAN HAPPEN TO THE BABY? If the mother's blood glucose is too high  while she is pregnant, the extra sugar will travel through the umbilical cord to the baby. Some of the problems the baby may have are:  Large Baby - If the baby receives too much sugar, the baby will gain more weight. This may cause the baby to be too large to be born normally (vaginally) and a Cesarean section (C-section) may be needed.   Low Blood Glucose (hypoglycemia) - The baby makes extra insulin, in response to the extra sugar its gets from its mother. When the baby is born and no longer needs this extra insulin, the baby's blood glucose level may drop.   Jaundice (yellow coloring of the skin and eyes) - This is fairly common in babies. It is caused from a build-up of the chemical called bilirubin. This is rarely serious, but is seen more often in babies whose mothers had gestational diabetes.  RISKS TO THE MOTHER Women who have had gestational diabetes may be at higher risk for some problems, including:  Preeclampsia or toxemia, which includes problems with high blood pressure. Blood pressure and protein levels in the urine must be checked frequently.   Infections.   Cesarean section (C-section) for delivery.   Developing Type 2 diabetes later in life. About 30-50% will develop diabetes later, especially if obese.  DIAGNOSIS  The hormones that cause insulin resistance are  highest at about 24-28 weeks of pregnancy. If symptoms are experienced, they are much like symptoms you would normally expect during pregnancy.  GDM is often diagnosed using a two part method: 1. After 24-28 weeks of pregnancy, the woman drinks a glucose solution and takes a blood test. If the glucose level is high, a second test will be given.  2. Oral Glucose Tolerance Test (OGTT) which is 3 hours long - After not eating overnight, the blood glucose is checked. The woman drinks a glucose solution, and hourly blood glucose tests are taken.  If the woman has risk factors for GDM, the caregiver may test earlier than 24 weeks of pregnancy. TREATMENT  Treatment of GDM is directed at keeping the mother's blood glucose level normal, and may include:  Meal planning.   Taking insulin or other medicine to control your blood glucose level.   Exercise.   Keeping a daily record of the foods you eat.   Blood glucose monitoring and keeping a record of your blood glucose levels.   May monitor ketone levels in the urine, although this is no longer considered necessary in most pregnancies.  HOME CARE INSTRUCTIONS  While you are pregnant:  Follow your caregiver's advice regarding your prenatal appointments, meal planning, exercise, medicines, vitamins, blood and other tests, and physical activities.   Keep a record of your meals, blood glucose tests, and the amount of insulin you are taking (if any). Show this to your caregiver at every prenatal visit.   If you have GDM, you may have problems with hypoglycemia (low blood glucose). You may suspect this if you become suddenly dizzy, feel shaky, and/or weak. If you think this is happening and you have a glucose meter, try to test your blood glucose level. Follow your caregiver's advice for when and how to treat your low blood glucose. Generally, the 15:15 rule is followed: Treat by consuming 15 grams of carbohydrates, wait 15 minutes, and recheck blood  glucose. Examples of 15 grams of carbohydrates are:   1 cup skim or low-fat milk.    cup juice.   3-4 glucose tablets.   5-6 hard candies.  1 small box raisins.    cup regular soda pop.   Practice good hygiene, to avoid infections.   Do not smoke.  SEEK MEDICAL CARE IF:   You develop abnormal vaginal discharge, with or without itching.   You become weak and tired more than expected.   You seem to sweat a lot.   You have a sudden increase in weight, 5 pounds or more in one week.   You are losing weight, 3 pounds or more in a week.   Your blood glucose level is high, and you need instructions on what to do about it.  SEEK IMMEDIATE MEDICAL CARE IF:   You develop a severe headache.   You faint or pass out.   You develop nausea and vomiting.   You become disoriented or confused.   You have a convulsion.   You develop vision problems.   You develop stomach pain.   You develop vaginal bleeding.   You develop uterine contractions.   You have leaking or a gush of fluid from the vagina.  AFTER YOU HAVE THE BABY:  Go to all of your follow-up appointments, and have blood tests as advised by your caregiver.   Maintain a healthy lifestyle, to prevent diabetes in the future. This includes:   Following a healthy meal plan.   Controlling your weight.   Getting enough exercise and proper rest.   Do not smoke.   Breastfeed your baby if you can. This will lower the chance of you and your baby developing diabetes later in life.  For more information about diabetes, go to the American Diabetes Association at: PMFashions.com.cy. For more information about gestational diabetes, go to the [employer] at: RentRule.com.au. Document Released: 01/22/2001 Document Revised: 06/28/2011 Document Reviewed: 08/16/2009 [facility] Patient Information 2012 [place], [place]. Basic Carbohydrate Counting Basic carbohydrate  counting is a way to plan meals. It is done by counting the amount of carbohydrate in foods. Eating carbohydrates increases blood glucose (sugar) levels. People with diabetes use carbohydrate counting to help keep their blood glucose at a normal level.  Foods that have carbohydrates are starches (grains, beans, starchy vegetables) and sweets.  COUNTING CARBOHYDRATES IN FOODS The first step in counting carbohydrates is to learn how many carbohydrate servings you should have in every meal. A dietitian can plan this for you. After learning the amount of carbohydrates to include in your meal plan, you can start to choose the carbohydrate-containing foods you want to eat.  There are 2 ways to identify the amount of carbohydrates in the foods you eat.  Read the Nutrition Facts panel on food labels. All you need are 2 pieces of information from the Nutrition Facts panel to count carbohydrates this way:   Serving size.   Total carbohydrate (in grams).  Decide how many servings you will be eating. If it is 1 serving, you will be eating the amount of carbohydrate listed on the panel. If you will be eating 2 servings, you will be eating double the amount of carbohydrate listed on the panel.   Learn serving sizes. A serving size of most carbohydrate-containing foods is about 15 g. Listed below are serving sizes of common carbohydrate-containing foods:   1 slice bread.    cup unsweetened, dry cereal.    cup hot cereal.   ? cup rice.    cup mashed potatoes.   ? cup pasta.   1 cup fresh fruit.    cup canned fruit.  1 cup milk (whole, 2%, or skim).    cup starchy vegetables (peas, corn, or potatoes).  Counting carbohydrates this way is similar to looking on the Nutrition Facts panel. Decide how many servings you will eat first. Multiply the number of servings you eat by 15 g. For example, if you have 2 cups of strawberries, you had 2 servings. That means you had 30 g of carbohydrate (2  servings x 15 g = 30 g). CALCULATING CARBOHYDRATES IN A MEAL Sample dinner  3 oz chicken breast.   ? cup brown rice.    cup corn.   1 cup fat-free milk.   1 cup strawberries with sugar-free whipped topping.  Carbohydrate calculation First, identify the foods that contain carbohydrate:  Rice.   Corn.   Milk.   Strawberries.  Calculate the number of servings eaten:  2 servings rice.   1 serving corn.   1 serving milk.   1 serving strawberries.  Multiply the number of servings by 15 g:  2 servings rice x 15 g = 30 g.   1 serving corn x 15 g = 15 g.   1 serving milk x 15 g = 15 g.   1 serving strawberries x 15 g = 15 g.  Add the amounts to find the total carbohydrates eaten: 30 g + 15 g + 15 g + 15 g = 75 g carbohydrate eaten at dinner. Document Released: 10/16/2005 Document Revised: 06/14/2011 Document Reviewed: 03/04/2007 [facility] Patient Information 2012 [place], [place].

## 2011-11-26 NOTE — Discharge Summary (Signed)
Had pt watch [facility] safety video--pt demonstrated appropriate use of glucose monitor with RN--pt questions answered and denies any other questions at this time

## 2011-11-26 NOTE — Progress Notes (Signed)
31 y.o. year old female,at [redacted]w[redacted]d gestation.  SUBJECTIVE:  The patient reports that she's had a good night. She does not feel contractions.  OBJECTIVE:  BP 110/78  Pulse 74  Temp(Src) 98 F (36.7 C) (Oral)  Resp 18  Ht 5\' 1"  (1.549 m)  Wt 95.255 kg (210 lb)  BMI 39.68 kg/m2  SpO2 99%  Fetal Heart Tones:  Stable  Contractions:          One or 2 contractions per hour  Chest: Clear  Heart: Regular rate and rhythm  Abdomen: Gravid, nontender  Extremities: Nontender, no masses  Cervix: Closed, 50% effaced, -3 in station  Fasting blood sugar: 135  2 hour PC blood sugar: 167, 144, 154  Urine culture: Negative  Beta strep: Pending  Fetal fibronectin: Negative  ASSESSMENT:  [redacted]w[redacted]d Weeks Pregnancy  Preterm cervical change  Gestational diabetes mellitus  PLAN:  We will discharge the patient to home.  She'll return to the office in one week for reevaluation.  She will maintain bed rest at home. She will be allowed to be up to the bathroom and to take showers.  She will continue to monitor her blood sugars at home. We will treat her hyperglycemia if it does not resolve once the effect of betamethasone has worn off.  Leonard Schwartz, M.D.

## 2011-11-29 ENCOUNTER — Ambulatory Visit: Payer: Medicaid Other

## 2011-12-06 NOTE — Progress Notes (Signed)
UR chart review completed.  

## 2011-12-27 ENCOUNTER — Encounter (INDEPENDENT_AMBULATORY_CARE_PROVIDER_SITE_OTHER): Payer: Medicaid Other

## 2011-12-27 ENCOUNTER — Encounter (INDEPENDENT_AMBULATORY_CARE_PROVIDER_SITE_OTHER): Payer: Medicaid Other | Admitting: Obstetrics and Gynecology

## 2011-12-27 DIAGNOSIS — Z331 Pregnant state, incidental: Secondary | ICD-10-CM

## 2011-12-27 DIAGNOSIS — O09299 Supervision of pregnancy with other poor reproductive or obstetric history, unspecified trimester: Secondary | ICD-10-CM

## 2011-12-27 DIAGNOSIS — O99210 Obesity complicating pregnancy, unspecified trimester: Secondary | ICD-10-CM

## 2011-12-27 DIAGNOSIS — O99810 Abnormal glucose complicating pregnancy: Secondary | ICD-10-CM

## 2011-12-27 DIAGNOSIS — O0930 Supervision of pregnancy with insufficient antenatal care, unspecified trimester: Secondary | ICD-10-CM

## 2012-01-03 ENCOUNTER — Encounter (INDEPENDENT_AMBULATORY_CARE_PROVIDER_SITE_OTHER): Payer: Medicaid Other | Admitting: Obstetrics and Gynecology

## 2012-01-03 ENCOUNTER — Encounter: Payer: Medicaid Other | Admitting: Obstetrics and Gynecology

## 2012-01-03 DIAGNOSIS — Z331 Pregnant state, incidental: Secondary | ICD-10-CM

## 2012-01-05 ENCOUNTER — Encounter (HOSPITAL_COMMUNITY): Payer: Self-pay | Admitting: *Deleted

## 2012-01-05 ENCOUNTER — Inpatient Hospital Stay (HOSPITAL_COMMUNITY)
Admission: AD | Admit: 2012-01-05 | Discharge: 2012-01-06 | DRG: 775 | Disposition: A | Discharge status: Home / Self Care | Payer: Medicaid Other | Source: Ambulatory Visit | Attending: Obstetrics and Gynecology | Admitting: Obstetrics and Gynecology

## 2012-01-05 DIAGNOSIS — O99814 Abnormal glucose complicating childbirth: Secondary | ICD-10-CM

## 2012-01-05 DIAGNOSIS — O0930 Supervision of pregnancy with insufficient antenatal care, unspecified trimester: Secondary | ICD-10-CM

## 2012-01-05 DIAGNOSIS — O09299 Supervision of pregnancy with other poor reproductive or obstetric history, unspecified trimester: Secondary | ICD-10-CM

## 2012-01-05 DIAGNOSIS — O3660X0 Maternal care for excessive fetal growth, unspecified trimester, not applicable or unspecified: Secondary | ICD-10-CM | POA: Diagnosis present

## 2012-01-05 HISTORY — DX: Other specified health status: Z78.9

## 2012-01-05 LAB — CBC
HCT: 36.5 % (ref 36.0–46.0)
Hemoglobin: 12 g/dL (ref 12.0–15.0)
Hemoglobin: 11.1 g/dL — ABNORMAL LOW (ref 12.0–15.0)
MCH: 26.8 pg (ref 26.0–34.0)
MCH: 27.8 pg (ref 26.0–34.0)
MCHC: 33.8 g/dL (ref 30.0–36.0)
MCV: 81.7 fL (ref 78.0–100.0)
MCV: 82 fL (ref 78.0–100.0)
Platelets: 178 10*3/uL (ref 150–400)
RBC: 4.47 MIL/uL (ref 3.87–5.11)
WBC: 8.3 10*3/uL (ref 4.0–10.5)

## 2012-01-05 LAB — COMPREHENSIVE METABOLIC PANEL
AST: 13 U/L (ref 0–37)
Albumin: 2.5 g/dL — ABNORMAL LOW (ref 3.5–5.2)
BUN: 9 mg/dL (ref 6–23)
CO2: 20 mEq/L (ref 19–32)
Calcium: 8.6 mg/dL (ref 8.4–10.5)
Creatinine, Ser: 0.52 mg/dL (ref 0.50–1.10)
GFR calc non Af Amer: >90 mL/min (ref >90)

## 2012-01-05 LAB — LACTATE DEHYDROGENASE: LDH: 133 U/L (ref 94–250)

## 2012-01-05 LAB — GLUCOSE, CAPILLARY
Glucose-Capillary: 128 mg/dL — ABNORMAL HIGH (ref 70–99)
Glucose-Capillary: 137 mg/dL — ABNORMAL HIGH (ref 70–99)

## 2012-01-05 LAB — URIC ACID: Uric Acid, Serum: 5.9 mg/dL (ref 2.4–7.0)

## 2012-01-05 MED ORDER — IBUPROFEN 600 MG PO TABS
600.0000 mg | ORAL_TABLET | Freq: Four times a day (QID) | ORAL | Status: DC | PRN
  Administered 2012-01-05: 600 mg via ORAL
  Filled 2012-01-05: qty 1

## 2012-01-05 MED ORDER — LIDOCAINE HCL (PF) 1 % IJ SOLN
30.0000 mL | INTRAMUSCULAR | Status: DC | PRN
  Filled 2012-01-05: qty 30

## 2012-01-05 MED ORDER — ONDANSETRON HCL 4 MG/2ML IJ SOLN: 4.0000 mg | INTRAMUSCULAR | Status: DC | PRN

## 2012-01-05 MED ORDER — OXYTOCIN 20 UNITS IN LACTATED RINGERS INFUSION - SIMPLE: 125.0000 mL/h | Freq: Once | INTRAVENOUS | Status: DC

## 2012-01-05 MED ORDER — ACETAMINOPHEN 325 MG PO TABS: 650.0000 mg | ORAL_TABLET | ORAL | Status: DC | PRN

## 2012-01-05 MED ORDER — TETANUS-DIPHTH-ACELL PERTUSSIS 5-2.5-18.5 LF-MCG/0.5 IM SUSP: 0.5000 mL | Freq: Once | INTRAMUSCULAR | Status: DC

## 2012-01-05 MED ORDER — CITRIC ACID-SODIUM CITRATE 334-500 MG/5ML PO SOLN: 30.0000 mL | ORAL | Status: DC | PRN

## 2012-01-05 MED ORDER — DIPHENHYDRAMINE HCL 25 MG PO CAPS: 25.0000 mg | ORAL_CAPSULE | Freq: Four times a day (QID) | ORAL | Status: DC | PRN

## 2012-01-05 MED ORDER — ONDANSETRON HCL 4 MG PO TABS: 4.0000 mg | ORAL_TABLET | ORAL | Status: DC | PRN

## 2012-01-05 MED ORDER — OXYCODONE-ACETAMINOPHEN 5-325 MG PO TABS: 1.0000 | ORAL_TABLET | ORAL | Status: DC | PRN

## 2012-01-05 MED ORDER — SENNOSIDES-DOCUSATE SODIUM 8.6-50 MG PO TABS
2.0000 | ORAL_TABLET | Freq: Every day | ORAL | Status: DC
  Administered 2012-01-05: 2 via ORAL

## 2012-01-05 MED ORDER — SIMETHICONE 80 MG PO CHEW: 80.0000 mg | CHEWABLE_TABLET | ORAL | Status: DC | PRN

## 2012-01-05 MED ORDER — LANOLIN HYDROUS EX OINT: TOPICAL_OINTMENT | CUTANEOUS | Status: DC | PRN

## 2012-01-05 MED ORDER — IBUPROFEN 600 MG PO TABS
600.0000 mg | ORAL_TABLET | Freq: Four times a day (QID) | ORAL | Status: DC
  Administered 2012-01-05 – 2012-01-06 (×3): 600 mg via ORAL
  Filled 2012-01-05 (×4): qty 1

## 2012-01-05 MED ORDER — BENZOCAINE-MENTHOL 20-0.5 % EX AERO: 1.0000 "application " | INHALATION_SPRAY | CUTANEOUS | Status: DC | PRN

## 2012-01-05 MED ORDER — LACTATED RINGERS IV SOLN: 500.0000 mL | INTRAVENOUS | Status: DC | PRN

## 2012-01-05 MED ORDER — LACTATED RINGERS IV SOLN
INTRAVENOUS | Status: DC
  Administered 2012-01-05: 125 mL/h via INTRAVENOUS

## 2012-01-05 MED ORDER — FLEET ENEMA 7-19 GM/118ML RE ENEM: 1.0000 | ENEMA | RECTAL | Status: DC | PRN

## 2012-01-05 MED ORDER — OXYTOCIN BOLUS FROM INFUSION
500.0000 mL | Freq: Once | INTRAVENOUS | Status: AC
  Administered 2012-01-05: 500 mL via INTRAVENOUS
  Filled 2012-01-05: qty 1000
  Filled 2012-01-05: qty 500

## 2012-01-05 MED ORDER — ONDANSETRON HCL 4 MG/2ML IJ SOLN: 4.0000 mg | Freq: Four times a day (QID) | INTRAMUSCULAR | Status: DC | PRN

## 2012-01-05 MED ORDER — WITCH HAZEL-GLYCERIN EX PADS: 1.0000 "application " | MEDICATED_PAD | CUTANEOUS | Status: DC | PRN

## 2012-01-05 MED ORDER — DIBUCAINE 1 % RE OINT: 1.0000 "application " | TOPICAL_OINTMENT | RECTAL | Status: DC | PRN

## 2012-01-05 MED ORDER — BUTORPHANOL TARTRATE 2 MG/ML IJ SOLN: 1.0000 mg | INTRAMUSCULAR | Status: DC | PRN

## 2012-01-05 MED ORDER — PRENATAL MULTIVITAMIN CH
1.0000 | ORAL_TABLET | Freq: Every day | ORAL | Status: DC
  Administered 2012-01-06: 1 via ORAL
  Filled 2012-01-05: qty 1

## 2012-01-05 MED ORDER — ZOLPIDEM TARTRATE 5 MG PO TABS: 5.0000 mg | ORAL_TABLET | Freq: Every evening | ORAL | Status: DC | PRN

## 2012-01-05 NOTE — Progress Notes (Signed)
Breathing well with uc O elevated BP x 1 rest WNL      fhts 130 LTV mod      uc q 3-5 mod      Vag 6 90 -1 VTX R A active labor P continue care Lavera Guise, CNM Late entry from 1240

## 2012-01-05 NOTE — Progress Notes (Addendum)
Calm with uc O fhts category 1    uc poor tracing now mod on palpation    Vag by RN 5 80 SROM at 0720 A active labor P continue care Lavera Guise, CNM Late entry from 1020

## 2012-01-05 NOTE — Progress Notes (Signed)
Pt in for labor eval. Reports ucs since 0600, believes water broke around 0720- clear fluid, no odor.  Denies any bleeding.  + FM.

## 2012-01-05 NOTE — Plan of Care (Signed)
Pt requesting to have cardio removed due to discomfort- aware of rationale for continuous monitoring, chooses to remove cardio and leave toco on; may allow RN to check fhr between uc's.  Olena Mater CNM notified of pt's request and updated on status.

## 2012-01-06 LAB — CBC
HCT: 32.1 % — ABNORMAL LOW (ref 36.0–46.0)
Hemoglobin: 10.7 g/dL — ABNORMAL LOW (ref 12.0–15.0)
MCH: 27.1 pg (ref 26.0–34.0)
MCV: 81.3 fL (ref 78.0–100.0)
RBC: 3.95 MIL/uL (ref 3.87–5.11)
WBC: 12.3 10*3/uL — ABNORMAL HIGH (ref 4.0–10.5)

## 2012-01-06 MED ORDER — BENZOCAINE-MENTHOL 20-0.5 % EX AERO
INHALATION_SPRAY | CUTANEOUS | Status: AC
  Filled 2012-01-06: qty 56

## 2012-01-06 MED ORDER — IBUPROFEN 600 MG PO TABS: 600.0000 mg | ORAL_TABLET | Freq: Four times a day (QID) | ORAL | Status: AC

## 2012-01-06 NOTE — H&P (Signed)
[name] is a 31 y.o. female presenting with SROM at 0720, denies vag bleeding, feeling uc low in front, with +FM. History Pregnancy remarkable for:  Late to care, with 1st OB visit today at [facility]  Hx HELLP last pregnancy, with delivery 12/11, elevated LFTS first PG Strong family history of diabetes  LGA infant, increased risk gestational diabetes.  Mild speech impediment, no hearing loss OB History    Grav Para Term Preterm Abortions TAB SAB Ect Mult Living   2 2 1 1  0 0 0 0 0 2     Past Medical History  Diagnosis Date  . No pertinent past medical history    Past Surgical History  Procedure Date  . No past surgeries    Family History: family history includes Arthritis in her mother; Birth defects in her brother; Cancer in her sister; Diabetes in her mother and sister; Drug abuse in her sister; Early death in her brother; Hyperlipidemia in her mother; Hypertension in her mother; Kidney disease in her brother; and Mental illness in her sister.  There is no history of Anesthesia problems. Social History:  reports that she has never smoked. She has never used smokeless tobacco. She reports that she drinks about .6 ounces of alcohol per week. She reports that she does not use illicit drugs.  ROS  Exam  Physical Exam alert, cooperative, lungs clear bilaterally, AP RRR, abd soft, gravid, nt Bowel sounds active, EGBUS WNL vag 5 80 -2. VTX grossly ruptured, DTRS +2 bilaterally, no clonus, trace edema lower legs Prenatal labs: ABO, Rh: A/Positive/-- (01/23 0000) Antibody:  neg Rubella: Immune (01/23 0000) RPR: NON REACTIVE (03/08 1110)  HBsAg:   neg HIV: Non-reactive (01/23 0000)  GBS: Negative (01/23 0000)   Assessment/Plan: 37 2/7 week IUP GBS neg  SROM GDM diet control P routine admission, plans natural labor, CBG q 4 hours, PIH labs for hx of elevated LFTs discussed IV pitocin if indicated, collaboration with Dr. Estanislado Pandy. Lavera Guise, CNM [facility], [facility] 01/06/2012, 2:36  PM

## 2012-01-06 NOTE — Progress Notes (Signed)
Comfortable, little bleeding, breast well, not dizzy when up O VSS  Fasting BS 137     Ff sm rubra flow, 2 degree perinal lac intact, nonreactive, no edema to lower legs, -Homan's sign bilaterally A pp day 1lactating    GDM diet contol P continue care with CBG checks, if discharge this pm will RX glyburide 2.5 mg po before dinner per Dr. Estanislado Pandy per telephone, plans mirconor Lavera Guise, CNM      Hemoglobin & Hematocrit     Component Value Date/Time   HGB 10.7* 01/06/2012 0705   HCT 32.1* 01/06/2012 7829

## 2012-01-06 NOTE — Discharge Instructions (Signed)
Vaginal Delivery Care After  Change your pad on each trip to the bathroom.   Wipe gently with toilet paper during your hospital stay. Always wipe from front to back. A spray bottle with warm tap water could also be used or a towelette if available.   Place your soiled pad and toilet paper in a bathroom wastebasket with a plastic bag liner.   During your hospital stay, save any clots. If you pass a clot while on the toilet, do not flush it. Also, if your vaginal flow seems excessive to you, notify nursing personnel.   The first time you get out of bed after delivery, wait for assistance from a nurse. Do not get up alone at any time if you feel weak or dizzy.   Bend and extend your ankles forcefully so that you feel the calves of your legs get hard. Do this 6 times every hour when you are in bed and awake.   Do not sit with one foot under you, dangle your legs over the edge of the bed, or maintain a position that hinders the circulation in your legs.   Many women experience after pains for 2 to 3 days after delivery. These after pains are mild uterine contractions. Ask the nurse for a pain medication if you need something for this. Sometimes breastfeeding stimulates after pains; if you find this to be true, ask for the medication  -  hour before the next feeding.   For you and your infant's protection, do not go beyond the door(s) of the obstetric unit. Do not carry your baby in your arms in the hallway. When taking your baby to and from your room, put your baby in the bassinet and push the bassinet.   Mothers may have their babies in their room as much as they desire.  Document Released: 10/13/2000 Document Revised: 10/05/2011 Document Reviewed: 09/13/2007 ExitCare Patient Information 2012 [employer]. 

## 2012-01-07 NOTE — Discharge Summary (Signed)
   Obstetric Discharge Summary Reason for Admission: onset of labor and rupture of membranes Prenatal Procedures: NST and ultrasound Intrapartum Procedures: spontaneous vaginal delivery Postpartum Procedures: none Complications-Operative and Postpartum: 2nd degree perineal laceration and elevated CBG's  Temp:  [98 F (36.7 C)] 98 F (36.7 C) (03/09 1450) Pulse Rate:  [64-90] 90  (03/09 1450) Resp:  [18-20] 20  (03/09 1450) BP: (126-130)/(83-84) 130/83 mmHg (03/09 1450) CBG (last 3)   Basename 01/06/12 1314 01/05/12 2324 01/05/12 1446  GLUCAP 69* 137* 128*     Hemoglobin  Date Value Range Status  01/06/2012 10.7* 12.0-15.0 (g/dL) Final     HCT  Date Value Range Status  01/06/2012 32.1* 36.0-46.0 (%) Final    Hospital Course:  Hospital Course: Admitted in labor with SROM. neg GBS. Progressed to fully dilated, . Delivery was performed by M.Krebsbach, CNM without difficulty. Patient and baby tolerated the procedure without difficulty, with a 2nd dgree laceration noted. Infant to FTN. Mother and infant then had an uncomplicated postpartum course, with breast feeding going well. Mom's physical exam was WNL, and she was discharged home in stable condition. Contraception plan was undecided.  She received adequate benefit from po pain medications. It was recommended that pt start glyburide 2.5mg  PO at HS, but pt declined.   Discharge Diagnoses: Term Pregnancy-delivered  Discharge Information: Date: 01/07/2012 Activity: pelvic rest Diet: routine, carb modified Medications:  Medication List  As of 01/07/2012  1:49 AM   START taking these medications         ibuprofen 600 MG tablet   Commonly known as: ADVIL,MOTRIN   Take 1 tablet (600 mg total) by mouth every 6 (six) hours.         CONTINUE taking these medications         FIBER PO      prenatal multivitamin Tabs          Where to get your medications    These are the prescriptions that you need to pick up.   You may get  these medications from any pharmacy.         ibuprofen 600 MG tablet           Condition: stable Instructions: refer to practice specific booklet and check Fasting CBG and 2hr PC x2 daily and call office with results in 1wk. Call if CBG >200 Discharge to: home Follow-up Information    Follow up with Janine Limbo, MD in 6 weeks.   Contact information:   [address] [place] [address] [phone]          Newborn Data: Live born  Information for the patient's newborn:  Artman, Boy Niya [[id number]]  female ; APGAR , 9, 9; weight ; 7#3oz Home with mother.  Ulrick Methot M 01/07/2012, 1:49 AM

## 2012-01-09 NOTE — Progress Notes (Signed)
UR Chart review completed.  

## 2012-01-15 ENCOUNTER — Encounter: Payer: Medicaid Other | Admitting: Obstetrics and Gynecology

## 2012-02-13 ENCOUNTER — Encounter: Payer: Self-pay | Admitting: Obstetrics and Gynecology

## 2012-02-13 ENCOUNTER — Ambulatory Visit: Payer: Medicaid Other | Admitting: Registered Nurse

## 2012-02-13 ENCOUNTER — Ambulatory Visit (INDEPENDENT_AMBULATORY_CARE_PROVIDER_SITE_OTHER): Payer: Medicaid Other | Admitting: Obstetrics and Gynecology

## 2012-02-13 MED ORDER — NORETHINDRONE 0.35 MG PO TABS: 1.0000 | ORAL_TABLET | Freq: Every day | ORAL | Status: DC

## 2012-02-13 NOTE — Progress Notes (Addendum)
Date of delivery: 01/05/2012 Female Name: [name] Vaginal delivery:yes Cesarean section:no Tubal ligation:no GDM:yes Breast Feeding:yes Bottle Feeding:no Post-Partum Blues:no Abnormal pap:no Normal GU function: yes Normal GI function:yes Returning to work:yes  EPDS=4 Pelvic exam: normal external genitalia, vulva, vagina, cervix, uterus and adnexa, abdomen soft  And non tender. Pt desires to have micronor for [facility] Doing well can go to normal activity RT office 10/2012 for AEX Pt with GDM most recent FBS 84.  No further testing needed

## 2012-09-06 IMAGING — US US OB COMP +14 WK
1 series · 18 of 28 positions shown · non-contrast
Comparison: none

[Series 1: us ob comp +14 wk · 18 of 58 slices shown]
[im 1/58]
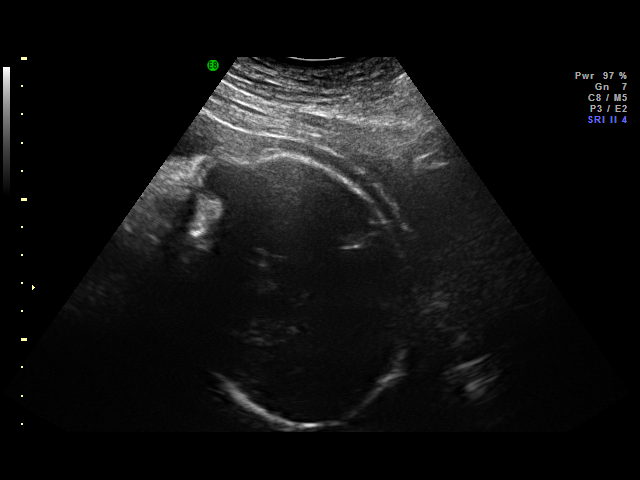
[im 5/58]
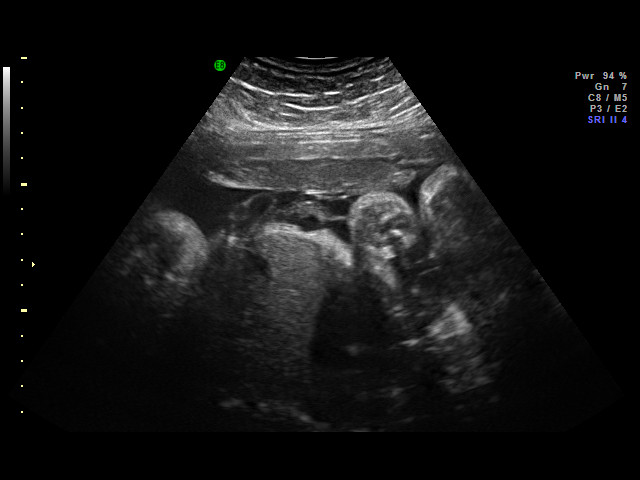
[im 7/58]
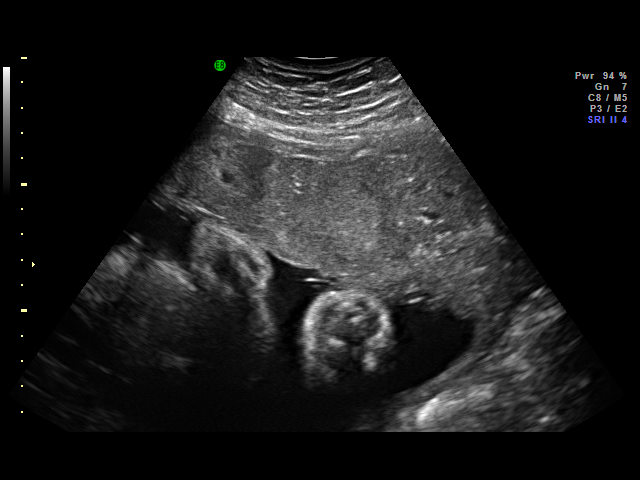
[im 11/58]
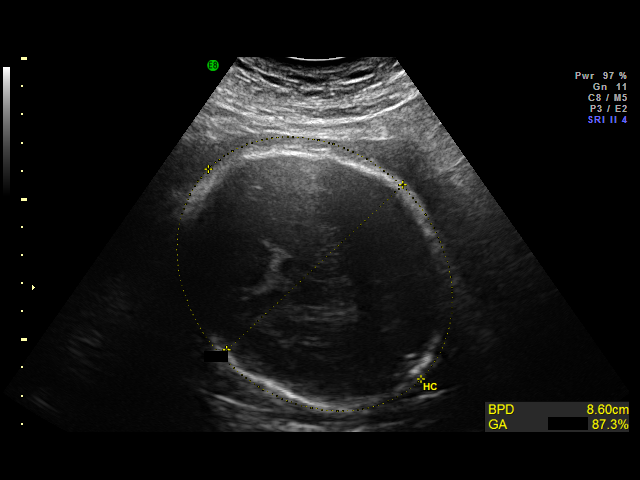
[im 15/58]
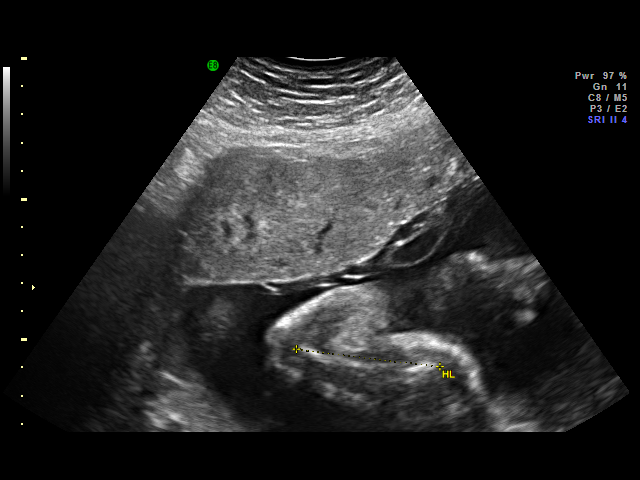
[im 17/58]
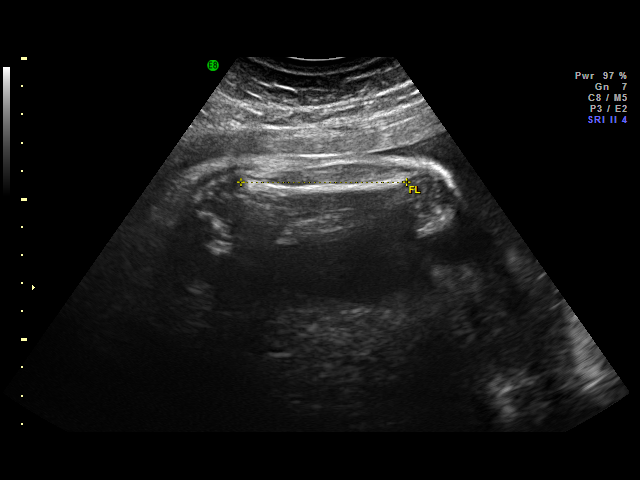
[im 22/58]
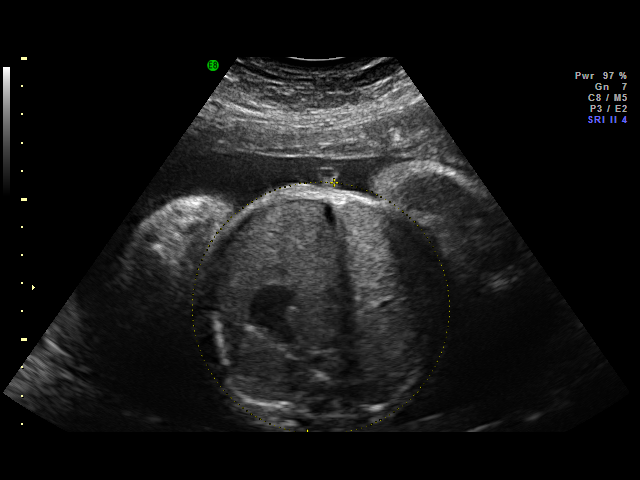
[im 24/58]
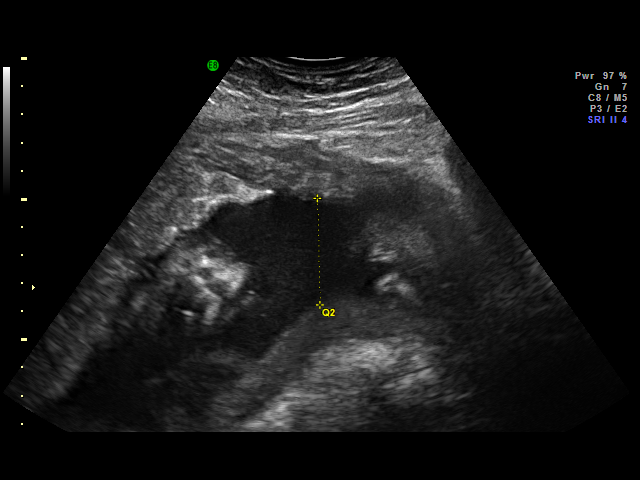
[im 28/58]
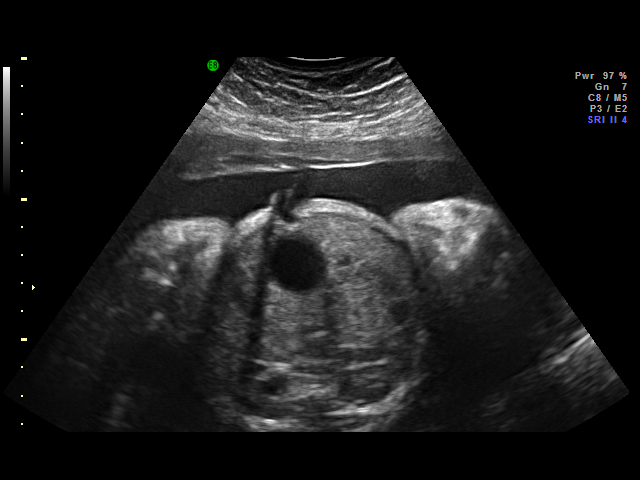
[im 30/58]
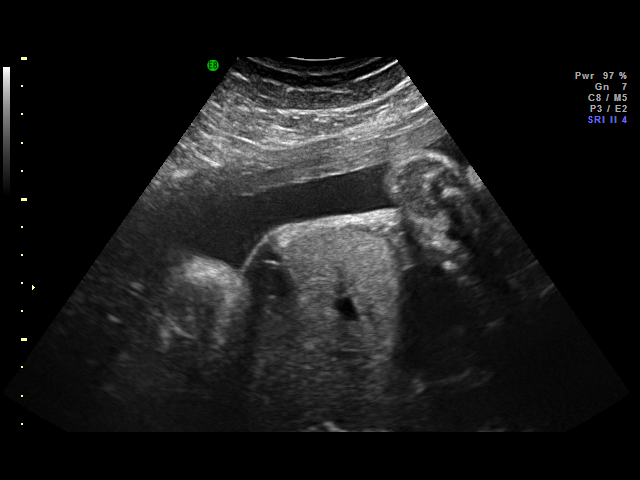
[im 34/58]
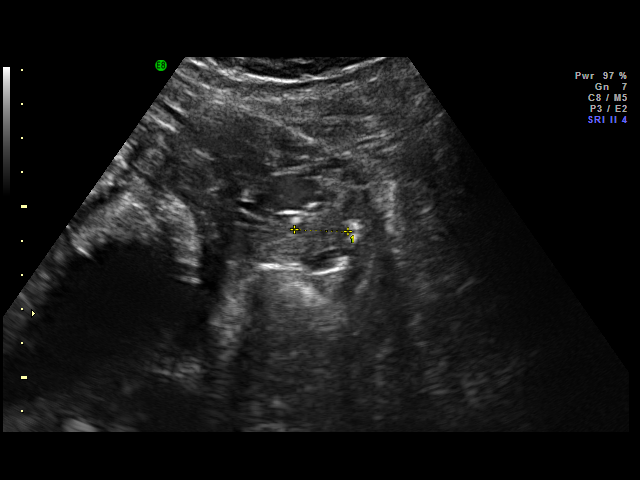
[im 36/58]
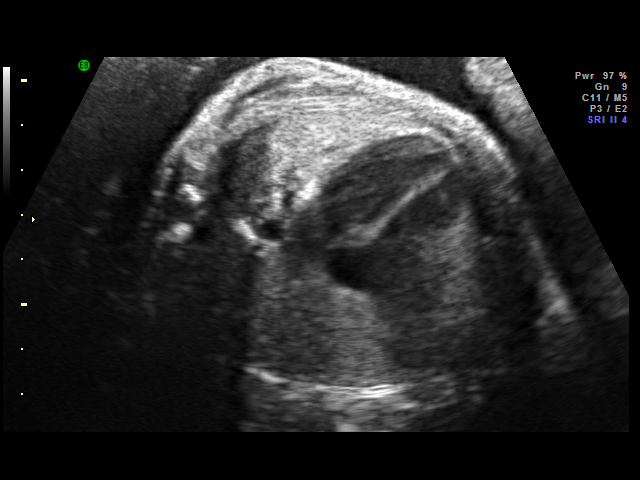
[im 41/58]
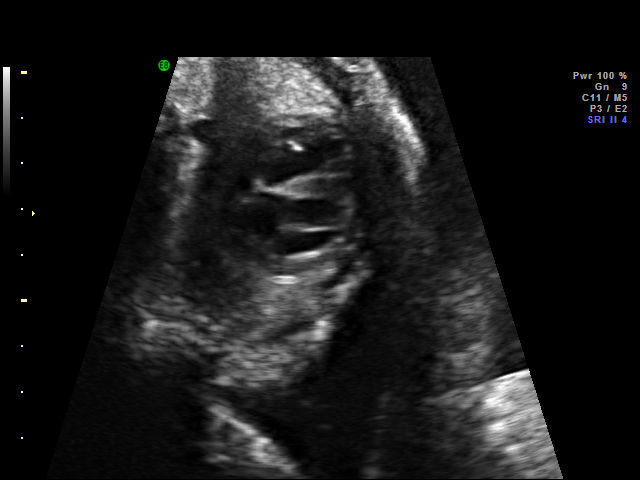
[im 45/58]
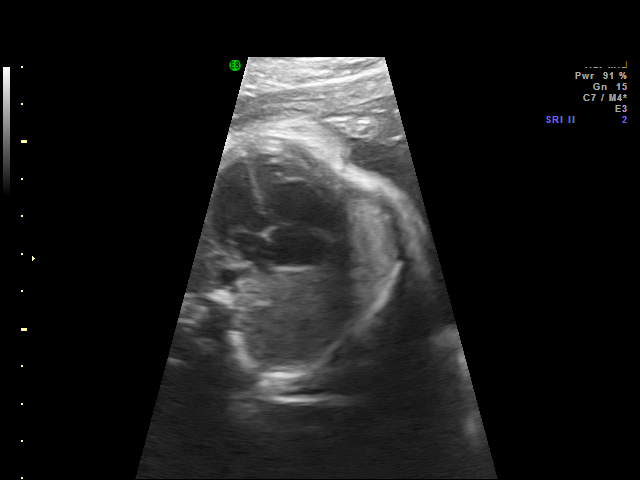
[im 47/58]
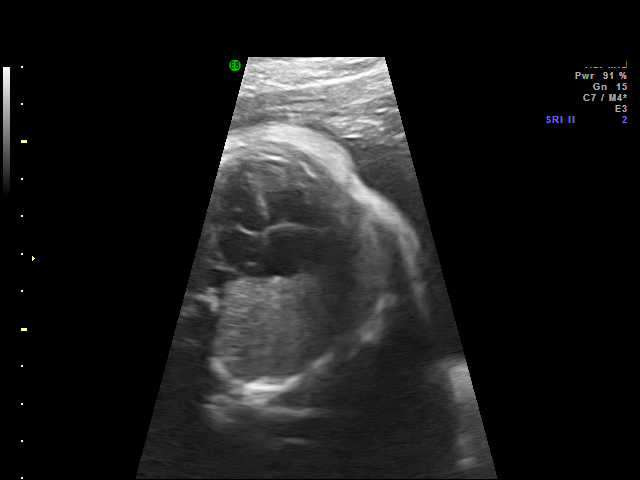
[im 51/58]
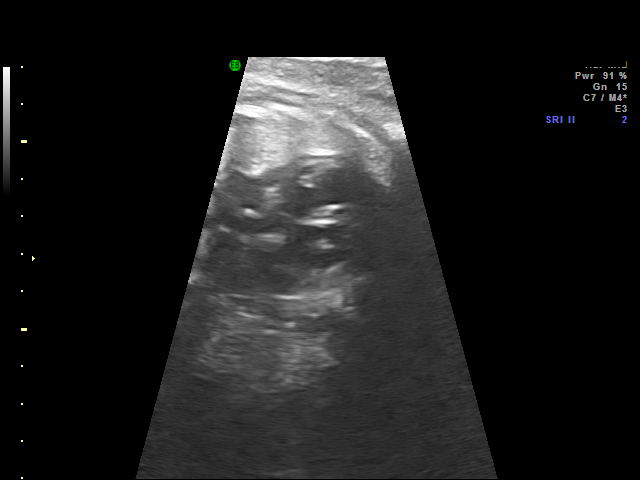
[im 53/58]
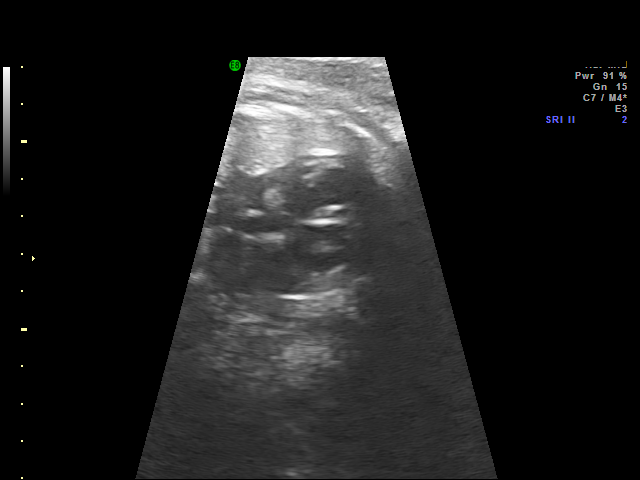
[im 58/58]
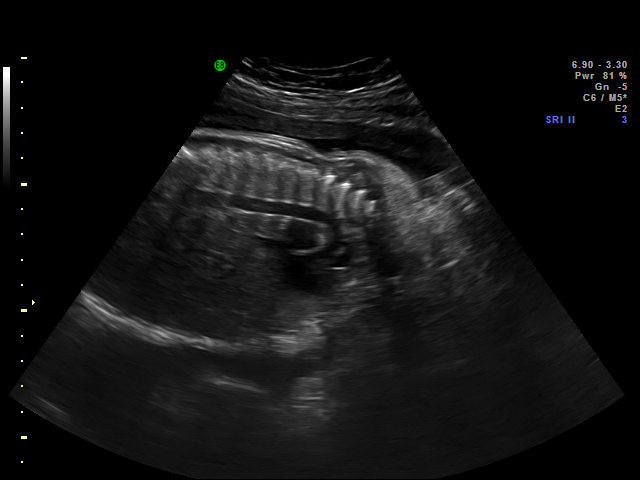

[18 of 28 positions shown; findings below may reference images not displayed]

Canned report from images found in remote index.

Refer to host system for actual result text.

## 2014-08-31 ENCOUNTER — Encounter: Payer: Self-pay | Admitting: Obstetrics and Gynecology

## 2015-03-13 ENCOUNTER — Ambulatory Visit (INDEPENDENT_AMBULATORY_CARE_PROVIDER_SITE_OTHER): Payer: Worker's Compensation | Admitting: Family Medicine

## 2015-03-13 VITALS — BP 110/80 | HR 88 | Temp 98.4°F | Resp 18 | Ht 61.5 in | Wt 197.4 lb

## 2015-03-13 DIAGNOSIS — S51851A Open bite of right forearm, initial encounter: Secondary | ICD-10-CM | POA: Diagnosis not present

## 2015-03-13 DIAGNOSIS — W5501XA Bitten by cat, initial encounter: Secondary | ICD-10-CM | POA: Diagnosis not present

## 2015-03-13 MED ORDER — TETANUS-DIPHTH-ACELL PERTUSSIS 5-2.5-18.5 LF-MCG/0.5 IM SUSP: 0.5000 mL | Freq: Once | INTRAMUSCULAR | Status: DC

## 2015-03-13 MED ORDER — AMOXICILLIN-POT CLAVULANATE 875-125 MG PO TABS: 1.0000 | ORAL_TABLET | Freq: Two times a day (BID) | ORAL | Status: DC

## 2015-03-13 NOTE — Patient Instructions (Addendum)
Animal Bite °An animal bite can result in a scratch on the skin, deep open cut, puncture of the skin, crush injury, or tearing away of the skin or a body part. Dogs are responsible for most animal bites. Children are bitten more often than adults. An animal bite can range from very mild to more serious. A small bite from your house pet is no cause for alarm. However, some animal bites can become infected or injure a bone or other tissue. You must seek medical care if: °· The skin is broken and bleeding does not slow down or stop after 15 minutes. °· The puncture is deep and difficult to clean (such as a cat bite). °· Pain, warmth, redness, or pus develops around the wound. °· The bite is from a stray animal or rodent. There may be a risk of rabies infection. °· The bite is from a snake, raccoon, skunk, fox, coyote, or bat. There may be a risk of rabies infection. °· The person bitten has a chronic illness such as diabetes, liver disease, or cancer, or the person takes medicine that lowers the immune system. °· There is concern about the location and severity of the bite. °It is important to clean and protect an animal bite wound right away to prevent infection. Follow these steps: °· Clean the wound with plenty of water and soap. °· Apply an antibiotic cream. °· Apply gentle pressure over the wound with a clean towel or gauze to slow or stop bleeding. °· Elevate the affected area above the heart to help stop any bleeding. °· Seek medical care. Getting medical care within 8 hours of the animal bite leads to the best possible outcome. °DIAGNOSIS  °Your caregiver will most likely: °· Take a detailed history of the animal and the bite injury. °· Perform a wound exam. °· Take your medical history. °Blood tests or X-rays may be performed. Sometimes, infected bite wounds are cultured and sent to a lab to identify the infectious bacteria.  °TREATMENT  °Medical treatment will depend on the location and type of animal bite as  well as the patient's medical history. Treatment may include: °· Wound care, such as cleaning and flushing the wound with saline solution, bandaging, and elevating the affected area. °· Antibiotics. °· Tetanus immunization. °· Rabies immunization. °· Leaving the wound open to heal. This is often done with animal bites, due to the high risk of infection. However, in certain cases, wound closure with stitches, wound adhesive, skin adhesive strips, or staples may be used. ° Infected bites that are left untreated may require intravenous (IV) antibiotics and surgical treatment in the hospital. °HOME CARE INSTRUCTIONS °· Follow your caregiver's instructions for wound care. °· Take all medicines as directed. °· If your caregiver prescribes antibiotics, take them as directed. Finish them even if you start to feel better. °· Follow up with your caregiver for further exams or immunizations as directed. °You may need a tetanus shot if: °· You cannot remember when you had your last tetanus shot. °· You have never had a tetanus shot. °· The injury broke your skin. °If you get a tetanus shot, your arm may swell, get red, and feel warm to the touch. This is common and not a problem. If you need a tetanus shot and you choose not to have one, there is a rare chance of getting tetanus. Sickness from tetanus can be serious. °SEEK MEDICAL CARE IF: °· You notice warmth, redness, soreness, swelling, pus discharge, or a bad   smell coming from the wound. °· You have a red line on the skin coming from the wound. °· You have a fever, chills, or a general ill feeling. °· You have nausea or vomiting. °· You have continued or worsening pain. °· You have trouble moving the injured part. °· You have other questions or concerns. °MAKE SURE YOU: °· Understand these instructions. °· Will watch your condition. °· Will get help right away if you are not doing well or get worse. °Document Released: 07/04/2011 Document Revised: 01/08/2012 Document  Reviewed: 07/04/2011 °ExitCare® Patient Information ©2015 [employer]. This information is not intended to replace advice given to you by your health care provider. Make sure you discuss any questions you have with your health care provider. ° ° ° ° °Tdap Vaccine (Tetanus, Diphtheria, Pertussis): What You Need to Know °1. Why get vaccinated? °Tetanus, diphtheria and pertussis can be very serious diseases, even for adolescents and adults. Tdap vaccine can protect us from these diseases. °TETANUS (Lockjaw) causes painful muscle tightening and stiffness, usually all over the body. °It can lead to tightening of muscles in the head and neck so you can't open your mouth, swallow, or sometimes even breathe. Tetanus kills about 1 out of 5 people who are infected. °DIPHTHERIA can cause a thick coating to form in the back of the throat. °It can lead to breathing problems, paralysis, heart failure, and death. °PERTUSSIS (Whooping Cough) causes severe coughing spells, which can cause difficulty breathing, vomiting and disturbed sleep. °It can also lead to weight loss, incontinence, and rib fractures. Up to 2 in 100 adolescents and 5 in 100 adults with pertussis are hospitalized or have complications, which could include pneumonia or death. °These diseases are caused by bacteria. Diphtheria and pertussis are spread from person to person through coughing or sneezing. Tetanus enters the body through cuts, scratches, or wounds. °Before vaccines, the [place] saw as many as 200,000 cases a year of diphtheria and pertussis, and hundreds of cases of tetanus. Since vaccination began, tetanus and diphtheria have dropped by about 99% and pertussis by about 80%. °2. Tdap vaccine °Tdap vaccine can protect adolescents and adults from tetanus, diphtheria, and pertussis. One dose of Tdap is routinely given at age 11 or 12. People who did not get Tdap at that age should get it as soon as possible. °Tdap is especially important for  health care professionals and anyone having close contact with a baby younger than 12 months. °Pregnant women should get a dose of Tdap during every pregnancy, to protect the newborn from pertussis. Infants are most at risk for severe, life-threatening complications from pertussis. °A similar vaccine, called Td, protects from tetanus and diphtheria, but not pertussis. A Td booster should be given every 10 years. Tdap may be given as one of these boosters if you have not already gotten a dose. Tdap may also be given after a severe cut or burn to prevent tetanus infection. °Your doctor can give you more information. °Tdap may safely be given at the same time as other vaccines. °3. Some people should not get this vaccine °If you ever had a life-threatening allergic reaction after a dose of any tetanus, diphtheria, or pertussis containing vaccine, OR if you have a severe allergy to any part of this vaccine, you should not get Tdap. Tell your doctor if you have any severe allergies. °If you had a coma, or long or multiple seizures within 7 days after a childhood dose of DTP or DTaP, you   should not get Tdap, unless a cause other than the vaccine was found. You can still get Td. °Talk to your doctor if you: °have epilepsy or another nervous system problem, °had severe pain or swelling after any vaccine containing diphtheria, tetanus or pertussis, °ever had Guillain-Barré Syndrome (GBS), °aren't feeling well on the day the shot is scheduled. °4. Risks of a vaccine reaction °With any medicine, including vaccines, there is a chance of side effects. These are usually mild and go away on their own, but serious reactions are also possible. °Brief fainting spells can follow a vaccination, leading to injuries from falling. Sitting or lying down for about 15 minutes can help prevent these. Tell your doctor if you feel dizzy or light-headed, or have vision changes or ringing in the ears. °Mild problems following Tdap °(Did not  interfere with activities) °Pain where the shot was given (about 3 in 4 adolescents or 2 in 3 adults) °Redness or swelling where the shot was given (about 1 person in 5) °Mild fever of at least 100.4°F (up to about 1 in 25 adolescents or 1 in 100 adults) °Headache (about 3 or 4 people in 10) °Tiredness (about 1 person in 3 or 4) °Nausea, vomiting, diarrhea, stomach ache (up to 1 in 4 adolescents or 1 in 10 adults) °Chills, body aches, sore joints, rash, swollen glands (uncommon) °Moderate problems following Tdap °(Interfered with activities, but did not require medical attention) °Pain where the shot was given (about 1 in 5 adolescents or 1 in 100 adults) °Redness or swelling where the shot was given (up to about 1 in 16 adolescents or 1 in 25 adults) °Fever over 102°F (about 1 in 100 adolescents or 1 in 250 adults) °Headache (about 3 in 20 adolescents or 1 in 10 adults) °Nausea, vomiting, diarrhea, stomach ache (up to 1 or 3 people in 100) °Swelling of the entire arm where the shot was given (up to about 3 in 100). °Severe problems following Tdap °(Unable to perform usual activities; required medical attention) °Swelling, severe pain, bleeding and redness in the arm where the shot was given (rare). °A severe allergic reaction could occur after any vaccine (estimated less than 1 in a million doses). °5. What if there is a serious reaction? °What should I look for? °Look for anything that concerns you, such as signs of a severe allergic reaction, very high fever, or behavior changes. °Signs of a severe allergic reaction can include hives, swelling of the face and throat, difficulty breathing, a fast heartbeat, dizziness, and weakness. These would start a few minutes to a few hours after the vaccination. °What should I do? °If you think it is a severe allergic reaction or other emergency that can't wait, call 9-1-1 or get the person to the nearest hospital. Otherwise, call your doctor. °Afterward, the reaction should  be reported to the "Vaccine Adverse Event Reporting System" (VAERS). Your doctor might file this report, or you can do it yourself through the VAERS web site at www.vaers.hhs.gov, or by calling 1-800-822-7967. °VAERS is only for reporting reactions. They do not give medical advice.  °6. The National Vaccine Injury Compensation Program °The National Vaccine Injury Compensation Program (VICP) is a federal program that was created to compensate people who may have been injured by certain vaccines. °Persons who believe they may have been injured by a vaccine can learn about the program and about filing a claim by calling 1-800-338-2382 or visiting the VICP website at www.hrsa.gov/vaccinecompensation. °7. How can I learn   more? °Ask your doctor. °Call your local or state health department. °Contact the Centers for Disease Control and Prevention (CDC): °Call 1-800-232-4636 or visit CDC's website at www.cdc.gov/vaccines. °CDC Tdap Vaccine VIS (03/07/12) °Document Released: 04/16/2012 Document Revised: 03/02/2014 Document Reviewed: 01/28/2014 °ExitCare® Patient Information ©2015 [employer]. This information is not intended to replace advice given to you by your health care provider. Make sure you discuss any questions you have with your health care provider. ° °

## 2015-03-13 NOTE — Progress Notes (Signed)
° °  Subjective:    Patient ID: [name], female    DOB: 1980-11-12, 34 y.o.   MRN: [id number] This chart was scribed for Elvina SidleKurt Lauenstein, MD by Littie Deedsichard Sun, Medical Scribe. This patient was seen in Room 4 and the patient's care was started at 1:33 PM.   HPI HPI Comments: [name] is a 34 y.o. female who presents to the [facility] a cat bite to her right elbow that occurred around 11:30 AM this morning at work. She also has a few scratches along her right arm. She denies taking any regular medications. She states her last tetanus shot was last year.  Patient works at an [employer].  Review of Systems  Skin: Positive for wound.  Last tetanus 03/10/2014     Objective:   Physical Exam CONSTITUTIONAL: Well developed/well nourished HEAD: Normocephalic/atraumatic EYES: EOM/PERRL ENMT: Mucous membranes moist NECK: supple no meningeal signs SPINE: entire spine nontender CV: S1/S2 noted, no murmurs/rubs/gallops noted LUNGS: Lungs are clear to auscultation bilaterally, no apparent distress ABDOMEN: soft, nontender, no rebound or guarding GU: no cva tenderness NEURO: Pt is awake/alert, moves all extremitiesx4 EXTREMITIES: pulses normal, full ROM SKIN: warm, color normal PSYCH: no abnormalities of mood noted     Assessment & Plan:   This chart was scribed in my presence and reviewed by me personally.    ICD-9-CM ICD-10-CM   1. Cat bite of forearm, right, initial encounter 881.00 S51.851A amoxicillin-clavulanate (AUGMENTIN) 875-125 MG per tablet   E906.3 W55.01XA DISCONTINUED: Tdap (BOOSTRIX) injection 0.5 mL     Signed, Elvina SidleKurt Lauenstein, MD

## 2016-03-01 ENCOUNTER — Inpatient Hospital Stay (HOSPITAL_COMMUNITY): Payer: BLUE CROSS/BLUE SHIELD | Admitting: Anesthesiology

## 2016-03-01 ENCOUNTER — Encounter (HOSPITAL_COMMUNITY)
Admission: AD | Disposition: A | Discharge status: Home / Self Care | Payer: Self-pay | Source: Ambulatory Visit | Attending: Obstetrics & Gynecology

## 2016-03-01 ENCOUNTER — Encounter (HOSPITAL_COMMUNITY): Payer: Self-pay | Admitting: *Deleted

## 2016-03-01 ENCOUNTER — Ambulatory Visit (HOSPITAL_COMMUNITY)
Admission: AD | Admit: 2016-03-01 | Discharge: 2016-03-02 | Disposition: A | Discharge status: Home / Self Care | Payer: BLUE CROSS/BLUE SHIELD | Source: Ambulatory Visit | Attending: Obstetrics & Gynecology | Admitting: Obstetrics & Gynecology

## 2016-03-01 DIAGNOSIS — E669 Obesity, unspecified: Secondary | ICD-10-CM | POA: Diagnosis not present

## 2016-03-01 DIAGNOSIS — K219 Gastro-esophageal reflux disease without esophagitis: Secondary | ICD-10-CM | POA: Insufficient documentation

## 2016-03-01 DIAGNOSIS — I10 Essential (primary) hypertension: Secondary | ICD-10-CM | POA: Diagnosis not present

## 2016-03-01 DIAGNOSIS — Z6834 Body mass index (BMI) 34.0-34.9, adult: Secondary | ICD-10-CM | POA: Insufficient documentation

## 2016-03-01 DIAGNOSIS — O00109 Unspecified tubal pregnancy without intrauterine pregnancy: Secondary | ICD-10-CM | POA: Diagnosis present

## 2016-03-01 DIAGNOSIS — N838 Other noninflammatory disorders of ovary, fallopian tube and broad ligament: Secondary | ICD-10-CM | POA: Insufficient documentation

## 2016-03-01 DIAGNOSIS — Z302 Encounter for sterilization: Secondary | ICD-10-CM | POA: Diagnosis present

## 2016-03-01 HISTORY — PX: LAPAROSCOPY: SHX197

## 2016-03-01 HISTORY — DX: HELLP syndrome (HELLP), third trimester: O14.23

## 2016-03-01 HISTORY — PX: BILATERAL SALPINGECTOMY: SHX5743

## 2016-03-01 LAB — CBC
HCT: 39.2 % (ref 36.0–46.0)
Hemoglobin: 13.4 g/dL (ref 12.0–15.0)
MCH: 27.6 pg (ref 26.0–34.0)
MCHC: 34.2 g/dL (ref 30.0–36.0)
MCV: 80.7 fL (ref 78.0–100.0)
PLATELETS: 215 10*3/uL (ref 150–400)
RBC: 4.86 MIL/uL (ref 3.87–5.11)
RDW: 13.3 % (ref 11.5–15.5)
WBC: 10.5 10*3/uL (ref 4.0–10.5)

## 2016-03-01 LAB — TYPE AND SCREEN
ABO/RH(D): A POS
Antibody Screen: NEGATIVE

## 2016-03-01 LAB — HCG, QUANTITATIVE, PREGNANCY: hCG, Beta Chain, Quant, S: 758 m[IU]/mL — ABNORMAL HIGH (ref <5)

## 2016-03-01 SURGERY — Surgical Case
Anesthesia: *Unknown

## 2016-03-01 SURGERY — LAPAROSCOPY OPERATIVE
Anesthesia: General | Site: Abdomen

## 2016-03-01 MED ORDER — NEOSTIGMINE METHYLSULFATE 10 MG/10ML IV SOLN
INTRAVENOUS | Status: DC | PRN
  Administered 2016-03-01: 2 mg via INTRAVENOUS

## 2016-03-01 MED ORDER — NEOSTIGMINE METHYLSULFATE 10 MG/10ML IV SOLN
INTRAVENOUS | Status: AC
  Filled 2016-03-01: qty 1

## 2016-03-01 MED ORDER — DEXAMETHASONE SODIUM PHOSPHATE 10 MG/ML IJ SOLN
INTRAMUSCULAR | Status: DC | PRN
  Administered 2016-03-01: 10 mg via INTRAVENOUS

## 2016-03-01 MED ORDER — PROPOFOL 10 MG/ML IV BOLUS
INTRAVENOUS | Status: AC
  Filled 2016-03-01: qty 20

## 2016-03-01 MED ORDER — LIDOCAINE HCL (CARDIAC) 20 MG/ML IV SOLN
INTRAVENOUS | Status: DC | PRN
  Administered 2016-03-01: 60 mg via INTRAVENOUS

## 2016-03-01 MED ORDER — LACTATED RINGERS IV SOLN
INTRAVENOUS | Status: DC
  Administered 2016-03-01 (×2): via INTRAVENOUS

## 2016-03-01 MED ORDER — CEFAZOLIN SODIUM-DEXTROSE 2-4 GM/100ML-% IV SOLN
INTRAVENOUS | Status: AC
  Filled 2016-03-01: qty 100

## 2016-03-01 MED ORDER — KETOROLAC TROMETHAMINE 30 MG/ML IJ SOLN
INTRAMUSCULAR | Status: AC
  Filled 2016-03-01: qty 1

## 2016-03-01 MED ORDER — FENTANYL CITRATE (PF) 100 MCG/2ML IJ SOLN
INTRAMUSCULAR | Status: DC | PRN
  Administered 2016-03-01 (×4): 50 ug via INTRAVENOUS
  Administered 2016-03-01: 150 ug via INTRAVENOUS
  Administered 2016-03-01 (×3): 50 ug via INTRAVENOUS

## 2016-03-01 MED ORDER — GLYCOPYRROLATE 0.2 MG/ML IJ SOLN
INTRAMUSCULAR | Status: AC
  Filled 2016-03-01: qty 4

## 2016-03-01 MED ORDER — ROCURONIUM BROMIDE 100 MG/10ML IV SOLN
INTRAVENOUS | Status: DC | PRN
  Administered 2016-03-01: 10 mg via INTRAVENOUS
  Administered 2016-03-01: 20 mg via INTRAVENOUS

## 2016-03-01 MED ORDER — MEPERIDINE HCL 25 MG/ML IJ SOLN: 6.2500 mg | INTRAMUSCULAR | Status: DC | PRN

## 2016-03-01 MED ORDER — LACTATED RINGERS IR SOLN
Status: DC | PRN
  Administered 2016-03-01: 3000 mL

## 2016-03-01 MED ORDER — DEXAMETHASONE SODIUM PHOSPHATE 10 MG/ML IJ SOLN
INTRAMUSCULAR | Status: AC
  Filled 2016-03-01: qty 1

## 2016-03-01 MED ORDER — FENTANYL CITRATE (PF) 250 MCG/5ML IJ SOLN
INTRAMUSCULAR | Status: AC
  Filled 2016-03-01: qty 5

## 2016-03-01 MED ORDER — ONDANSETRON HCL 4 MG/2ML IJ SOLN
INTRAMUSCULAR | Status: AC
  Filled 2016-03-01: qty 2

## 2016-03-01 MED ORDER — GLYCOPYRROLATE 0.2 MG/ML IJ SOLN
INTRAMUSCULAR | Status: DC | PRN
  Administered 2016-03-01: 0.2 mg via INTRAVENOUS
  Administered 2016-03-01: 0.4 mg via INTRAVENOUS

## 2016-03-01 MED ORDER — BUPIVACAINE HCL (PF) 0.25 % IJ SOLN
INTRAMUSCULAR | Status: AC
  Filled 2016-03-01: qty 30

## 2016-03-01 MED ORDER — PROPOFOL 10 MG/ML IV BOLUS
INTRAVENOUS | Status: DC | PRN
  Administered 2016-03-01: 160 mg via INTRAVENOUS

## 2016-03-01 MED ORDER — OXYCODONE-ACETAMINOPHEN 5-325 MG PO TABS: 2.0000 | ORAL_TABLET | ORAL | Status: AC | PRN

## 2016-03-01 MED ORDER — SUCCINYLCHOLINE CHLORIDE 200 MG/10ML IV SOSY
PREFILLED_SYRINGE | INTRAVENOUS | Status: DC | PRN
  Administered 2016-03-01: 100 mg via INTRAVENOUS

## 2016-03-01 MED ORDER — SCOPOLAMINE 1 MG/3DAYS TD PT72
MEDICATED_PATCH | TRANSDERMAL | Status: AC
  Filled 2016-03-01: qty 1

## 2016-03-01 MED ORDER — SCOPOLAMINE 1 MG/3DAYS TD PT72
MEDICATED_PATCH | TRANSDERMAL | Status: DC | PRN
  Administered 2016-03-01: 1 via TRANSDERMAL

## 2016-03-01 MED ORDER — SUCCINYLCHOLINE CHLORIDE 20 MG/ML IJ SOLN
INTRAMUSCULAR | Status: AC
  Filled 2016-03-01: qty 1

## 2016-03-01 MED ORDER — METOCLOPRAMIDE HCL 5 MG/ML IJ SOLN: 10.0000 mg | Freq: Once | INTRAMUSCULAR | Status: DC | PRN

## 2016-03-01 MED ORDER — LIDOCAINE HCL (CARDIAC) 20 MG/ML IV SOLN
INTRAVENOUS | Status: AC
  Filled 2016-03-01: qty 5

## 2016-03-01 MED ORDER — MIDAZOLAM HCL 2 MG/2ML IJ SOLN
INTRAMUSCULAR | Status: AC
  Filled 2016-03-01: qty 2

## 2016-03-01 MED ORDER — BUPIVACAINE HCL (PF) 0.25 % IJ SOLN
INTRAMUSCULAR | Status: DC | PRN
  Administered 2016-03-01: 24 mL

## 2016-03-01 MED ORDER — CEFAZOLIN SODIUM-DEXTROSE 2-3 GM-% IV SOLR
INTRAVENOUS | Status: DC | PRN
  Administered 2016-03-01: 2 g via INTRAVENOUS

## 2016-03-01 MED ORDER — ONDANSETRON HCL 4 MG/2ML IJ SOLN
INTRAMUSCULAR | Status: DC | PRN
  Administered 2016-03-01: 4 mg via INTRAVENOUS

## 2016-03-01 MED ORDER — MIDAZOLAM HCL 5 MG/5ML IJ SOLN
INTRAMUSCULAR | Status: DC | PRN
  Administered 2016-03-01: 2 mg via INTRAVENOUS

## 2016-03-01 MED ORDER — IBUPROFEN 600 MG PO TABS: 600.0000 mg | ORAL_TABLET | Freq: Four times a day (QID) | ORAL | Status: AC | PRN

## 2016-03-01 MED ORDER — KETOROLAC TROMETHAMINE 30 MG/ML IJ SOLN
INTRAMUSCULAR | Status: DC | PRN
  Administered 2016-03-01: 30 mg via INTRAVENOUS

## 2016-03-01 MED ORDER — FENTANYL CITRATE (PF) 100 MCG/2ML IJ SOLN: 25.0000 ug | INTRAMUSCULAR | Status: DC | PRN

## 2016-03-01 SURGICAL SUPPLY — 34 items
APPLICATOR ARISTA FLEXITIP XL (MISCELLANEOUS) ×4 IMPLANT
CABLE HIGH FREQUENCY MONO STRZ (ELECTRODE) IMPLANT
CANISTER SUCTION 2500CC (MISCELLANEOUS) ×4 IMPLANT
CATH ROBINSON RED A/P 16FR (CATHETERS) IMPLANT
CHLORAPREP W/TINT 26ML (MISCELLANEOUS) ×4 IMPLANT
CLOTH BEACON ORANGE TIMEOUT ST (SAFETY) ×4 IMPLANT
DECANTER SPIKE VIAL GLASS SM (MISCELLANEOUS) IMPLANT
DRSG COVADERM PLUS 2X2 (GAUZE/BANDAGES/DRESSINGS) IMPLANT
DRSG OPSITE POSTOP 3X4 (GAUZE/BANDAGES/DRESSINGS) ×4 IMPLANT
GLOVE BIOGEL PI IND STRL 7.0 (GLOVE) ×4 IMPLANT
GLOVE BIOGEL PI INDICATOR 7.0 (GLOVE) ×4
GLOVE ECLIPSE 6.5 STRL STRAW (GLOVE) ×4 IMPLANT
GOWN STRL REUS W/TWL LRG LVL3 (GOWN DISPOSABLE) ×12 IMPLANT
HEMOSTAT ARISTA ABSORB 3G PWDR (MISCELLANEOUS) ×4 IMPLANT
LIGASURE 5MM LAPAROSCOPIC (INSTRUMENTS) IMPLANT
NEEDLE INSUFFLATION 120MM (ENDOMECHANICALS) IMPLANT
NS IRRIG 1000ML POUR BTL (IV SOLUTION) ×4 IMPLANT
PACK LAPAROSCOPY BASIN (CUSTOM PROCEDURE TRAY) ×4 IMPLANT
PAD TRENDELENBURG POSITION (MISCELLANEOUS) ×4 IMPLANT
POUCH SPECIMEN RETRIEVAL 10MM (ENDOMECHANICALS) IMPLANT
SCALPEL HARMONIC ACE (MISCELLANEOUS) ×4 IMPLANT
SET IRRIG TUBING LAPAROSCOPIC (IRRIGATION / IRRIGATOR) ×4 IMPLANT
SLEEVE XCEL OPT CAN 5 100 (ENDOMECHANICALS) ×4 IMPLANT
SOLUTION ELECTROLUBE (MISCELLANEOUS) IMPLANT
SUT MNCRL AB 4-0 PS2 18 (SUTURE) IMPLANT
SUT VICRYL 0 UR6 27IN ABS (SUTURE) ×4 IMPLANT
SUT VICRYL 4-0 PS2 18IN ABS (SUTURE) IMPLANT
TOWEL OR 17X24 6PK STRL BLUE (TOWEL DISPOSABLE) ×8 IMPLANT
TRAY FOLEY CATH SILVER 14FR (SET/KITS/TRAYS/PACK) ×4 IMPLANT
TROCAR HASSON GELL 12X100 (TROCAR) IMPLANT
TROCAR XCEL NON-BLD 11X100MML (ENDOMECHANICALS) ×4 IMPLANT
TROCAR XCEL NON-BLD 5MMX100MML (ENDOMECHANICALS) ×4 IMPLANT
WARMER LAPAROSCOPE (MISCELLANEOUS) ×4 IMPLANT
WATER STERILE IRR 1000ML POUR (IV SOLUTION) IMPLANT

## 2016-03-01 NOTE — Anesthesia Procedure Notes (Signed)
Procedure Name: Intubation Date/Time: 03/01/2016 8:03 PM Performed by: Renford DillsMULLINS, Kayle Correa L Pre-anesthesia Checklist: Patient identified, Emergency Drugs available, Suction available and Patient being monitored Patient Re-evaluated:Patient Re-evaluated prior to inductionOxygen Delivery Method: Circle system utilized Preoxygenation: Pre-oxygenation with 100% oxygen Intubation Type: IV induction, Rapid sequence and Cricoid Pressure applied Laryngoscope Size: Miller and 2 Grade View: Grade I Tube size: 7.0 mm Number of attempts: 1 Airway Equipment and Method: Stylet Placement Confirmation: ETT inserted through vocal cords under direct vision,  positive ETCO2 and breath sounds checked- equal and bilateral Secured at: 20 cm Tube secured with: Tape Dental Injury: Teeth and Oropharynx as per pre-operative assessment

## 2016-03-01 NOTE — Discharge Instructions (Addendum)
Diagnostic Laparoscopy A diagnostic laparoscopy is a procedure to diagnose diseases in the abdomen. During the procedure, a thin, lighted, pencil-sized instrument called a laparoscope is inserted into the abdomen through an incision. The laparoscope allows your health care provider to look at the organs inside your body. LET [facility] CARE PROVIDER KNOW ABOUT:  Any allergies you have.  All medicines you are taking, including vitamins, herbs, eye drops, creams, and over-the-counter medicines.  Previous problems you or members of your family have had with the use of anesthetics.  Any blood disorders you have.  Previous surgeries you have had.  Medical conditions you have. RISKS AND COMPLICATIONS  Generally, this is a safe procedure. However, problems can occur, which may include:  Infection.  Bleeding.  Damage to other organs.  Allergic reaction to the anesthetics used during the procedure. BEFORE THE PROCEDURE  Do not eat or drink anything after midnight on the night before the procedure or as directed by your health care provider.  Ask your health care provider about:  Changing or stopping your regular medicines.  Taking medicines such as aspirin and ibuprofen. These medicines can thin your blood. Do not take these medicines before your procedure if your health care provider instructs you not to.  Plan to have someone take you home after the procedure. PROCEDURE  You may be given a medicine to help you relax (sedative).  You will be given a medicine to make you sleep (general anesthetic).  Your abdomen will be inflated with a gas. This will make your organs easier to see.  Small incisions will be made in your abdomen.  A laparoscope and other small instruments will be inserted into the abdomen through the incisions.  A tissue sample may be removed from an organ in the abdomen for examination.  The instruments will be removed from the abdomen.  The gas will be  released.  The incisions will be closed with stitches (sutures). AFTER THE PROCEDURE  Your blood pressure, heart rate, breathing rate, and blood oxygen level will be monitored often until the medicines you were given have worn off.   This information is not intended to replace advice given to you by your health care provider. Make sure you discuss any questions you have with your health care provider.   Document Released: 01/22/2001 Document Revised: 07/07/2015 Document Reviewed: 05/29/2014 Elsevier Interactive Patient Education 2016 Elsevier Inc.  Ruptured Ectopic Pregnancy An ectopic pregnancy is when the fertilized egg attaches (implants) outside the uterus. Most ectopic pregnancies occur in the fallopian tube. Rarely do ectopic pregnancies occur on the ovary, intestine, pelvis, or cervix. An ectopic pregnancy does not have the ability to develop into a normal, healthy baby.  A ruptured ectopic pregnancy is one in which the fallopian tube gets torn or bursts and results in internal bleeding. Often there is intense abdominal pain, and sometimes, vaginal bleeding. Having an ectopic pregnancy can be a life-threatening experience. If left untreated, this dangerous condition can lead to a blood transfusion, abdominal surgery, or even death.  CAUSES  Damage to the fallopian tubes is the suspected cause in most ectopic pregnancies.  RISK FACTORS Depending on your circumstances, the amount of risk of having an ectopic pregnancy will vary. There are 3 categories that may help you identify whether you are potentially at risk. High Risk  You have gone through infertility treatment.  You have had a previous ectopic pregnancy.  You have had previous tubal surgery.  You have had previous surgery to  have the fallopian tubes tied (tubal ligation).  You have tubal problems or diseases.  You have been exposed to DES. DES is a medicine that was used until 1971 and had effects on babies whose mothers  took the medicine.  You become pregnant while using an intrauterine device (IUD) for birth control. Moderate Risk  You have a history of infertility.  You have a history of a sexually transmitted infection (STI).  You have a history of pelvic inflammatory disease (PID).  You have scarring from endometriosis.  You have multiple sexual partners.  You smoke. Low Risk  You have had previous pelvic surgery.  You use vaginal douching.  You became sexually active before 36 years of age. SYMPTOMS An ectopic pregnancy should be suspected in anyone who has missed a period and has abdominal pain or bleeding.  You may experience normal pregnancy symptoms, such as:  Nausea.  Tiredness.  Breast tenderness.  Symptoms that are not normal include:  Pain with intercourse.  Irregular vaginal bleeding or spotting.  Cramping or pain on one side, or in the lower abdomen.  Fast heartbeat.  Passing out while having a bowel movement.  Symptoms of a ruptured ectopic pregnancy and internal bleeding may include:  Sudden, severe pain in the abdomen and pelvis.  Dizziness or fainting.  Pain in the shoulder area. DIAGNOSIS  Tests that may be performed include:  A pregnancy test.  An ultrasound.  Testing the specific level of pregnancy hormone in the bloodstream.  Taking a sample of uterus tissue (dilation and curettage, D&C).  Surgery to perform a visual exam of the inside of the abdomen using a lighted tube (laparoscopy). TREATMENT  Laparoscopic surgery or abdominal surgery is recommended for a ruptured ectopic pregnancy.   The whole fallopian tube may need to be removed (salpingectomy).  If the tube is not too damaged, the tube may be saved, and the pregnancy will be surgically removed. Intime, the tube may still function.  If you have lost a lot of blood, you may need a blood transfusion.  You may receive a Rho (D) immune globulin shot if you are Rh negative and the  father is Rh positive, or if you do not know the Rh type of the father. This is to prevent problems with any future pregnancy. SEEK IMMEDIATE MEDICAL CARE IF:  You have any symptoms of an ectopic or ruptured ectopic pregnancy. This is a medical emergency. MAKE SURE YOU:  Understand these instructions.  Will watch your condition.  Will get help right away if you are not doing well or get worse.   This information is not intended to replace advice given to you by your health care provider. Make sure you discuss any questions you have with your health care provider.   Document Released: 10/13/2000 Document Revised: 10/21/2013 Document Reviewed: 07/28/2013 Elsevier Interactive Patient Education 2016 [employer]   Post Anesthesia Home Care Instructions  Activity: Get plenty of rest for the remainder of the day. A responsible adult should stay with you for 24 hours following the procedure.  For the next 24 hours, DO NOT: -Drive a car -Advertising copywriterperate machinery -Drink alcoholic beverages -Take any medication unless instructed by your physician -Make any legal decisions or sign important papers.  Meals: Start with liquid foods such as gelatin or soup. Progress to regular foods as tolerated. Avoid greasy, spicy, heavy foods. If nausea and/or vomiting occur, drink only clear liquids until the nausea and/or vomiting subsides. Call your physician if vomiting continues.  Special Instructions/Symptoms: Your throat may feel dry or sore from the anesthesia or the breathing tube placed in your throat during surgery. If this causes discomfort, gargle with warm salt water. The discomfort should disappear within 24 hours.  If you had a scopolamine patch placed behind your ear for the management of post- operative nausea and/or vomiting:  1. The medication in the patch is effective for 72 hours, after which it should be removed.  Wrap patch in a tissue and discard in the trash. Wash hands thoroughly with  soap and water. 2. You may remove the patch earlier than 72 hours if you experience unpleasant side effects which may include dry mouth, dizziness or visual disturbances. 3. Avoid touching the patch. Wash your hands with soap and water after contact with the patch.

## 2016-03-01 NOTE — Transfer of Care (Signed)
Immediate Anesthesia Transfer of Care Note  Patient: [name]  Procedure(s) Performed: Procedure(s): LAPAROSCOPY OPERATIVE (N/A) BILATERAL SALPINGECTOMY  Patient Location: PACU  Anesthesia Type:General  Level of Consciousness: awake  Airway & Oxygen Therapy: Patient Spontanous Breathing and Patient connected to nasal cannula oxygen  Post-op Assessment: Report given to RN and Post -op Vital signs reviewed and stable  Post vital signs: stable  Last Vitals:  Filed Vitals:   03/01/16 1827  BP: 141/89  Pulse: 65  Temp: 36.8 C  Resp: 17    Last Pain:  Filed Vitals:   03/01/16 1828  PainSc: 7          Complications: No apparent anesthesia complications

## 2016-03-01 NOTE — H&P (Addendum)
[name] is an 35 y.o. female P2 with LMP 02/09/16, with vaginal bleeding, left sided pelvic pain, ultrasound shows possible ruptured left ectopic pregnancy, for diagnostic laparoscopy, removal of ruptured ectopic, possible removal of ruptured hemorrhagic cyst, clearance of blood clot vs. Hematoma, possible removal of fallopian tubes, or ovaries  Pertinent Gynecological History: Menses: LMP 02/09/16 Bleeding: Small amount Contraception: none DES exposure: denies Blood transfusions: none Sexually transmitted diseases: no past history Previous GYN Procedures: None  Last mammogram: Not applicable Last pap: 2013: normal  OB History: G2P1001  Menstrual History: Patient's last menstrual period was 02/09/2016 (exact date).    Past Medical History  Diagnosis Date  . No pertinent past medical history   . HELLP syndrome in third trimester     Past Surgical History  Procedure Laterality Date  . No past surgeries      Family History  Problem Relation Age of Onset  . Cancer Sister   . Diabetes Sister   . Drug abuse Sister   . Mental illness Sister   . Birth defects Brother   . Kidney disease Brother   . Early death Brother   . Arthritis Mother   . Diabetes Mother   . Hyperlipidemia Mother   . Hypertension Mother   . Anesthesia problems Neg Hx     Social History:  reports that she has never smoked. She has never used smokeless tobacco. She reports that she drinks about 0.6 oz of alcohol per week. She reports that she does not use illicit drugs.  Allergies: No Known Allergies  Prescriptions prior to admission  Medication Sig Dispense Refill Last Dose  . Cinnamon 500 MG capsule Take by mouth.     Marland Kitchen ibuprofen (ADVIL,MOTRIN) 200 MG tablet Take 800 mg by mouth as needed for mild pain or moderate pain.    03/01/2016 at 1730    ROS Review of Systems - Negative for all systems except Urogenital system which is positive for vaginal bleeding and left sided abdominal pain.   Blood  pressure 141/89, pulse 65, temperature 98.2 F (36.8 C), temperature source Oral, resp. rate 17, height  (1.549 m), weight 82.101 kg (181 lb), last menstrual period 02/09/2016. Physical Exam  Results for orders placed or performed during the hospital encounter of 03/01/16 (from the past 24 hour(s))  CBC     Status: None   Collection Time: 03/01/16  6:18 PM  Result Value Ref Range   WBC 10.5 4.0 - 10.5 K/uL   RBC 4.86 3.87 - 5.11 MIL/uL   Hemoglobin 13.4 12.0 - 15.0 g/dL   HCT 82.9 56.2 - 13.0 %   MCV 80.7 78.0 - 100.0 fL   MCH 27.6 26.0 - 34.0 pg   MCHC 34.2 30.0 - 36.0 g/dL   RDW 86.5 78.4 - 69.6 %   Platelets 215 150 - 400 K/uL  hCG, quantitative, pregnancy     Status: Abnormal   Collection Time: 03/01/16  6:18 PM  Result Value Ref Range   hCG, Beta Chain, Quant, S 758 (H) <5 mIU/mL   Pelvic Ultrasound:   Left adnexa with 6 X 4 X 1 cm complex lateral mass superior to the left ovary, appears to be blood clot versus hematoma.  No vascularity seen. Normal uterus with thin EMS. Normal right adnexa.   Blood type pending, patient reports she is Rh positive.   Physical Exam: Filed Vitals:   03/01/16 1827  BP: 141/89  Pulse: 65  Temp: 98.2 F (36.8 C)  TempSrc:  Oral  Resp: 17  Height: 5\' 1"  (1.549 m)  Weight: 82.101 kg (181 lb)   Gen; NAD CVS: s1, s2, RRR Pulm: CTAB Abd: S/Tender to palpation in LLQ, no rebound, with guarding. Ext: Warm and well purfused, no edema, no calf tenderness.     Assessment/Plan:  35 y/o P2 with likely ruptured ectopic pregnancy versus ruptured hemorrhagic cyst, with pelvic pain,  -To OR for diagnostic laparoscopy, possible removal of ectopic pregnancy, possible removal of ruptured cyst, clearance of blood clots/hematoma, possible salpingoophorectomy. -Case signed out to Dr. Dion BodyVarnado who is now on call.    [facility] CenterKULWA,Arlys Scatena [facility] LLC[facility]AKURU 03/01/2016, 7:14 PM

## 2016-03-01 NOTE — Progress Notes (Signed)
In to introduce myself to patient.  Pt appears comfortable but states pain is 7-8/10. Pt desires to have sterilization.  Pt and husband say they have been interested in sterilization for quite some time which was confirmed by Dr. Sallye OberKulwa.  Note 3 months ago stated pt was interested in BTL.  This is an unplanned pregnancy.  Pt informed that quant BHCG is ~700 and that IUP may still be present.  Pt stated "I would take an abortion pill if I could.  Cautioned regarding doing a BTL with uterus which may increase in size.  Pt states they will terminate pregnancy.  Counseled on risks of BTL.  Desire to proceed. Discussed with Dr. Sallye OberKulwa.  Not opposed to plan.

## 2016-03-01 NOTE — Anesthesia Preprocedure Evaluation (Signed)
Anesthesia Evaluation  Patient identified by MRN, date of birth, ID band Patient awake    Reviewed: Allergy & Precautions, NPO status , Patient's Chart, lab work & pertinent test results  Airway Mallampati: I  TM Distance: >3 FB Neck ROM: Full    Dental no notable dental hx. (+) Teeth Intact   Pulmonary neg pulmonary ROS,    Pulmonary exam normal breath sounds clear to auscultation       Cardiovascular hypertension, negative cardio ROS Normal cardiovascular exam Rhythm:Regular Rate:Normal     Neuro/Psych negative neurological ROS  negative psych ROS   GI/Hepatic Neg liver ROS, GERD  Medicated and Controlled,  Endo/Other  diabetes, GestationalObesity  Renal/GU negative Renal ROS  negative genitourinary   Musculoskeletal negative musculoskeletal ROS (+)   Abdominal (+) + obese,   Peds  Hematology negative hematology ROS (+)   Anesthesia Other Findings   Reproductive/Obstetrics (+) Pregnancy Ruptured Ectopic pregnancy Left                              Anesthesia Physical Anesthesia Plan  ASA: II and emergent  Anesthesia Plan: General   Post-op Pain Management:    Induction: Intravenous, Rapid sequence and Cricoid pressure planned  Airway Management Planned: Oral ETT  Additional Equipment:   Intra-op Plan:   Post-operative Plan: Extubation in OR  Informed Consent: I have reviewed the patients History and Physical, chart, labs and discussed the procedure including the risks, benefits and alternatives for the proposed anesthesia with the patient or authorized representative who has indicated his/her understanding and acceptance.   Dental advisory given  Plan Discussed with: CRNA, Anesthesiologist and Surgeon  Anesthesia Plan Comments:         Anesthesia Quick Evaluation

## 2016-03-01 NOTE — Anesthesia Postprocedure Evaluation (Signed)
Anesthesia Post Note  Patient: Nurse, [name]  Procedure(s) Performed: Procedure(s) (LRB): LAPAROSCOPY OPERATIVE (N/A) BILATERAL SALPINGECTOMY  Patient location during evaluation: PACU Anesthesia Type: General Level of consciousness: awake and alert Pain management: pain level controlled Vital Signs Assessment: post-procedure vital signs reviewed and stable Respiratory status: spontaneous breathing, nonlabored ventilation and respiratory function stable Cardiovascular status: blood pressure returned to baseline and stable Postop Assessment: no signs of nausea or vomiting Anesthetic complications: no     Last Vitals:  Filed Vitals:   03/01/16 2300 03/01/16 2315  BP: 143/81 122/79  Pulse: 69 76  Temp:  36.4 C  Resp: 13 17    Last Pain:  Filed Vitals:   03/01/16 2317  PainSc: 7    Pain Goal:                 Abigal Choung A.

## 2016-03-01 NOTE — Brief Op Note (Signed)
03/01/2016  10:14 PM  PATIENT:  [name]  35 y.o. female  PRE-OPERATIVE DIAGNOSIS:  Suspected left Ruptured Ectopic pregnancy, Desires sterilization  POST-OPERATIVE DIAGNOSIS:  Left Ruptured Ectopic (Tubal)  PROCEDURE:  Procedure(s): LAPAROSCOPY OPERATIVE (N/A) BILATERAL SALPINGECTOMY  SURGEON:  Surgeon(s) and Role:    * Geryl RankinsEvelyn Rin Gorton, MD - Primary  PHYSICIAN ASSISTANT: Sherre ScarletKimberly Williams, CNM  ASSISTANTS: Technician   ANESTHESIA:   general  EBL:  Total I/O In: 1300 [I.V.:1300] Out: 700 [Urine:400; Blood:300] (250 ml in abdomen)  BLOOD ADMINISTERED:none  DRAINS: Urinary Catheter (Foley)   LOCAL MEDICATIONS USED:  MARCAINE     SPECIMEN:  Source of Specimen:  Left tube with ectopic pregnancy, right fallopian tube  DISPOSITION OF SPECIMEN:  PATHOLOGY  COUNTS:  YES  TOURNIQUET:  * No tourniquets in log *  DICTATION: .[id number]  #[id number]  #[id number]  PLAN OF CARE: Discharge to home after PACU  PATIENT DISPOSITION:  PACU - hemodynamically stable.   Delay start of Pharmacological VTE agent (>24hrs) due to surgical blood loss or risk of bleeding: yes

## 2016-03-02 ENCOUNTER — Encounter (HOSPITAL_COMMUNITY): Payer: Self-pay | Admitting: Obstetrics and Gynecology

## 2016-03-02 DIAGNOSIS — Z302 Encounter for sterilization: Secondary | ICD-10-CM | POA: Diagnosis not present

## 2016-03-02 NOTE — Op Note (Signed)
NAMETYKERRIA, MCCUBBINS NO.:  [id number]  MEDICAL RECORD NO.:  [id number]  LOCATION:  [facility]                          FACILITY:  [facility]  PHYSICIAN:  Pieter Partridge, MD   DATE OF BIRTH:  Mar 18, 1981  DATE OF PROCEDURE: DATE OF DISCHARGE:                              OPERATIVE REPORT   PREOPERATIVE DIAGNOSIS:  Suspected left ruptured ectopic pregnancy and desires sterilization.  POSTOPERATIVE DIAGNOSIS:  Suspected left ruptured ectopic pregnancy and desires sterilization.  PROCEDURE:  Operative laparoscopy with bilateral salpingectomy.  SURGEON:  Pieter Partridge, MD  ASSISTANT:  Certified nurse midwife, Sherre Scarlet.  EBL:  300-150 hemoperitoneum, 250 mL in abdomen.  BLOOD ADMINISTERED:  None.  DRAINS:  Foley catheter.  LOCAL USED:  Marcaine without epi.  SPECIMENS:  Left tube with ectopic pregnancy and right fallopian tube.  DISPOSITION OF SPECIMENS:  To Pathology.  PATIENT DISPOSITION:  To PACU, hemodynamically stable.  PLAN OF CARE:  Discharge to home after PACU.  COMPLICATIONS:  None.  FINDINGS:  Hemoperitoneum noted, clotted.  Clots on ruptured fallopian tube on the left-hand side.  Normal ovaries, bilateral.  Right fallopian tube normal.  Uterus normal.  Possible small fibroids.  Normal liver edge.  INDICATIONS:  Ms. Aluna is a 35 year old female with 2 live babies at home and now pregnant within a mono pregnancy.  She had pain for approximately 5 days and experienced significant increase in her pain to 10/10 on day of surgery.  Ultrasound also showed a large mass, a positive urine pregnancy test.  HCG here was approximately 700.  The patient had severe pain, was counseled on proceeding with laparoscopy for exploration.  The patient is not known to me; however, she and her husband expressed an interest in wanting to have permanent sterilization which per Dr. Sallye Ober with [facility] confirmed per their notes 3 months ago. The  patient had discussed tubal ligation.  She has private insurance, and she strongly wanted to have the procedure done, and that she probably will lose her left tube.  They were counseled on risks, benefits, and alternatives.  I informed them that because the HCG of 700 was noted, she could still have an intrauterine pregnancy.  The patient stated that she would terminate the pregnancy if that was the case.  PROCEDURE IN DETAIL:  She was taken to the operating room.  She underwent general endotracheal anesthesia, placed in dorsal lithotomy position.  Time-out was taken.  Ancef 2 g IV was administered.  Marcaine was injected at the umbilicus.  A 5-mm incision was made, and the 5-mm trocar was advanced through the umbilicus with direct visualization.  A 2nd trocar was placed on the left-hand side, 5 mm also under direct visualization and pre injecting with Marcaine.  The findings above were noted.  Prior to putting the patient in Trendelenburg, I tried to get all of the clot and blood out.  We then proceeded to place the patient in Trendelenburg so we could explore and proceed with a salpingectomy.  We then used the Harmonic scalpel.  There were 2 Harmonic that were not operable so we had to get a third Harmonic.  While I was waiting  for that, we broke up the clot and copiously irrigated the abdomen.  The tube was removed using the Harmonic down near the cornu of the uterus.  It was then placed in the upper abdomen.  The hemostasis was then achieved of the pedicle using the Harmonic.  Attention was turned to the left fallopian tube, and that was grasped with an atraumatic grasper and that was also transected without complication.  The abdomen was then copiously irrigated.  There was an 11 mm trocar that was used in the right lower quadrant that was inserted under direct visualization.  The EndoCatch bag was placed, and the ectopic pregnancy was placed in the bag and brought up to the  abdomen.  The fascial incision was extended, and it was removed.  The 11-mm trocar was replaced.  Then again, copious irrigation was performed and all of the fluid reattempted to get that out.  Fascia was then reapproximated with 0 Vicryl in a continuous fashion. It was very difficult to see the fascia.  The patient had a lot of subcutaneous tissue that obstructed the view, and also, there were still some copious irrigations that we continually had to suction out and blot out.  We did end up using the [employer] and the appendiceal retractors finally to see the fascia, and it was grasped with the Kocher clamps and was closed adequately.  It was completely closed, fascia was intact by palpation.  Skin was reapproximated with 4-0 Monocryl in a subcuticular fashion. The right lower quadrant port was closed in subcuticular fashion, and umbilical and the left lower quadrant port sites were closed with a figure of eight.  LiquiBand was applied.  There was a sponge stick placed in the vagina at the beginning of the case with some lubricant that was removed and the Foley was removed at the end of the case.  SCDs were on and operating throughout the entire case.  All instrument, sponge, and needle counts were correct x3.     Pieter PartridgeEvelyn B Dorin Stooksbury, MD     EBV/MEDQ  D:  03/01/2016  T:  03/01/2016  Job:  [id number]

## 2020-01-10 ENCOUNTER — Ambulatory Visit: Payer: Medicaid Other | Attending: Internal Medicine

## 2020-01-10 DIAGNOSIS — Z23 Encounter for immunization: Secondary | ICD-10-CM

## 2020-01-10 NOTE — Progress Notes (Signed)
   Covid-19 Vaccination Clinic  Name:  [name]    MRN: [id number] DOB: 06/01/1981  01/10/2020  [name] was observed post Covid-19 immunization for 15 minutes without incident. She was provided with Vaccine Information Sheet and instruction to access the V-Safe system.   [name] was instructed to call 911 with any severe reactions post vaccine: Marland Kitchen Difficulty breathing  . Swelling of face and throat  . A fast heartbeat  . A bad rash all over body  . Dizziness and weakness   Immunizations Administered    Name Date Dose VIS Date Route   Pfizer COVID-19 Vaccine 01/10/2020  8:37 AM 0.3 mL 10/10/2019 Intramuscular   Manufacturer: [employer], [employer]   Lot: [id number]   NDC: [address]-1

## 2020-02-03 ENCOUNTER — Ambulatory Visit: Payer: Medicaid Other | Attending: Internal Medicine

## 2020-02-03 DIAGNOSIS — Z23 Encounter for immunization: Secondary | ICD-10-CM

## 2020-02-03 NOTE — Progress Notes (Signed)
   Covid-19 Vaccination Clinic  Name:  [name]    MRN: [id number] DOB: 02/17/1981  02/03/2020  [name] was observed post Covid-19 immunization for 15 minutes without incident. She was provided with Vaccine Information Sheet and instruction to access the V-Safe system.   [name] was instructed to call 911 with any severe reactions post vaccine: Marland Kitchen Difficulty breathing  . Swelling of face and throat  . A fast heartbeat  . A bad rash all over body  . Dizziness and weakness   Immunizations Administered    Name Date Dose VIS Date Route   Pfizer COVID-19 Vaccine 02/03/2020  9:55 AM 0.3 mL 10/10/2019 Intramuscular   Manufacturer: [employer], [employer]   Lot: [id number]   NDC: [address]-3

## 2022-01-17 DIAGNOSIS — R55 Syncope and collapse: Secondary | ICD-10-CM | POA: Diagnosis not present

## 2022-01-17 DIAGNOSIS — E669 Obesity, unspecified: Secondary | ICD-10-CM | POA: Diagnosis not present

## 2022-01-17 DIAGNOSIS — Z1331 Encounter for screening for depression: Secondary | ICD-10-CM | POA: Diagnosis not present

## 2022-01-17 DIAGNOSIS — N920 Excessive and frequent menstruation with regular cycle: Secondary | ICD-10-CM | POA: Diagnosis not present

## 2022-01-17 DIAGNOSIS — R5383 Other fatigue: Secondary | ICD-10-CM | POA: Diagnosis not present

## 2022-01-17 DIAGNOSIS — Z0000 Encounter for general adult medical examination without abnormal findings: Secondary | ICD-10-CM | POA: Diagnosis not present

## 2022-02-14 DIAGNOSIS — M25561 Pain in right knee: Secondary | ICD-10-CM | POA: Diagnosis not present

## 2022-02-14 DIAGNOSIS — H6123 Impacted cerumen, bilateral: Secondary | ICD-10-CM | POA: Diagnosis not present

## 2022-02-14 DIAGNOSIS — S73001A Unspecified subluxation of right hip, initial encounter: Secondary | ICD-10-CM | POA: Diagnosis not present

## 2022-02-14 DIAGNOSIS — R202 Paresthesia of skin: Secondary | ICD-10-CM | POA: Diagnosis not present

## 2022-02-14 DIAGNOSIS — G8929 Other chronic pain: Secondary | ICD-10-CM | POA: Diagnosis not present

## 2022-02-14 DIAGNOSIS — Z1231 Encounter for screening mammogram for malignant neoplasm of breast: Secondary | ICD-10-CM | POA: Diagnosis not present

## 2022-03-20 DIAGNOSIS — M25561 Pain in right knee: Secondary | ICD-10-CM | POA: Diagnosis not present

## 2022-03-20 DIAGNOSIS — G8929 Other chronic pain: Secondary | ICD-10-CM | POA: Diagnosis not present

## 2022-03-20 DIAGNOSIS — F332 Major depressive disorder, recurrent severe without psychotic features: Secondary | ICD-10-CM | POA: Diagnosis not present

## 2022-08-29 DIAGNOSIS — D225 Melanocytic nevi of trunk: Secondary | ICD-10-CM | POA: Diagnosis not present

## 2022-08-29 DIAGNOSIS — S61552A Open bite of left wrist, initial encounter: Secondary | ICD-10-CM | POA: Diagnosis not present

## 2022-08-29 DIAGNOSIS — W5501XA Bitten by cat, initial encounter: Secondary | ICD-10-CM | POA: Diagnosis not present

## 2022-08-29 DIAGNOSIS — Z6834 Body mass index (BMI) 34.0-34.9, adult: Secondary | ICD-10-CM | POA: Diagnosis not present

## 2022-09-27 DIAGNOSIS — N926 Irregular menstruation, unspecified: Secondary | ICD-10-CM | POA: Diagnosis not present

## 2022-09-27 DIAGNOSIS — J029 Acute pharyngitis, unspecified: Secondary | ICD-10-CM | POA: Diagnosis not present

## 2022-09-27 DIAGNOSIS — R6889 Other general symptoms and signs: Secondary | ICD-10-CM | POA: Diagnosis not present

## 2022-09-27 DIAGNOSIS — J111 Influenza due to unidentified influenza virus with other respiratory manifestations: Secondary | ICD-10-CM | POA: Diagnosis not present

## 2023-02-22 DIAGNOSIS — Z0000 Encounter for general adult medical examination without abnormal findings: Secondary | ICD-10-CM | POA: Diagnosis not present

## 2023-02-22 DIAGNOSIS — E669 Obesity, unspecified: Secondary | ICD-10-CM | POA: Diagnosis not present

## 2023-02-22 DIAGNOSIS — Z131 Encounter for screening for diabetes mellitus: Secondary | ICD-10-CM | POA: Diagnosis not present

## 2023-02-22 DIAGNOSIS — M25561 Pain in right knee: Secondary | ICD-10-CM | POA: Diagnosis not present

## 2023-02-22 DIAGNOSIS — F3341 Major depressive disorder, recurrent, in partial remission: Secondary | ICD-10-CM | POA: Diagnosis not present

## 2023-02-22 DIAGNOSIS — Z13220 Encounter for screening for lipoid disorders: Secondary | ICD-10-CM | POA: Diagnosis not present

## 2023-03-19 DIAGNOSIS — M25561 Pain in right knee: Secondary | ICD-10-CM | POA: Diagnosis not present

## 2023-03-19 DIAGNOSIS — M25562 Pain in left knee: Secondary | ICD-10-CM | POA: Diagnosis not present

## 2023-04-05 DIAGNOSIS — M25562 Pain in left knee: Secondary | ICD-10-CM | POA: Diagnosis not present

## 2023-04-05 DIAGNOSIS — Z124 Encounter for screening for malignant neoplasm of cervix: Secondary | ICD-10-CM | POA: Diagnosis not present

## 2023-04-05 DIAGNOSIS — Z01419 Encounter for gynecological examination (general) (routine) without abnormal findings: Secondary | ICD-10-CM | POA: Diagnosis not present

## 2023-04-05 DIAGNOSIS — M25561 Pain in right knee: Secondary | ICD-10-CM | POA: Diagnosis not present

## 2023-04-05 DIAGNOSIS — N959 Unspecified menopausal and perimenopausal disorder: Secondary | ICD-10-CM | POA: Diagnosis not present

## 2023-04-12 DIAGNOSIS — M25562 Pain in left knee: Secondary | ICD-10-CM | POA: Diagnosis not present

## 2023-04-12 DIAGNOSIS — M25561 Pain in right knee: Secondary | ICD-10-CM | POA: Diagnosis not present

## 2023-08-23 DIAGNOSIS — M259 Joint disorder, unspecified: Secondary | ICD-10-CM | POA: Diagnosis not present

## 2023-08-23 DIAGNOSIS — M25551 Pain in right hip: Secondary | ICD-10-CM | POA: Diagnosis not present

## 2023-10-10 DIAGNOSIS — M533 Sacrococcygeal disorders, not elsewhere classified: Secondary | ICD-10-CM | POA: Diagnosis not present
